# Patient Record
Sex: Female | Born: 1955 | Race: White | Hispanic: No | Marital: Single | Smoking: Former smoker
Health system: Southern US, Community
[De-identification: ages and names within clinical notes are randomized; demographics above are authoritative.]

## PROBLEM LIST (undated history)

## (undated) DIAGNOSIS — J45909 Unspecified asthma, uncomplicated: Secondary | ICD-10-CM

## (undated) DIAGNOSIS — F419 Anxiety disorder, unspecified: Secondary | ICD-10-CM

## (undated) DIAGNOSIS — F209 Schizophrenia, unspecified: Secondary | ICD-10-CM

## (undated) DIAGNOSIS — R569 Unspecified convulsions: Secondary | ICD-10-CM

## (undated) DIAGNOSIS — K589 Irritable bowel syndrome without diarrhea: Secondary | ICD-10-CM

## (undated) DIAGNOSIS — Z789 Other specified health status: Secondary | ICD-10-CM

## (undated) DIAGNOSIS — F32A Depression, unspecified: Secondary | ICD-10-CM

## (undated) DIAGNOSIS — I10 Essential (primary) hypertension: Secondary | ICD-10-CM

## (undated) DIAGNOSIS — M1990 Unspecified osteoarthritis, unspecified site: Secondary | ICD-10-CM

## (undated) DIAGNOSIS — J449 Chronic obstructive pulmonary disease, unspecified: Secondary | ICD-10-CM

## (undated) DIAGNOSIS — K219 Gastro-esophageal reflux disease without esophagitis: Secondary | ICD-10-CM

## (undated) HISTORY — DX: Depression, unspecified: F32.A

## (undated) HISTORY — DX: Chronic obstructive pulmonary disease, unspecified: J44.9

## (undated) HISTORY — DX: Anxiety disorder, unspecified: F41.9

## (undated) HISTORY — PX: COLONOSCOPY: SHX174

## (undated) HISTORY — DX: Gastro-esophageal reflux disease without esophagitis: K21.9

## (undated) HISTORY — DX: Unspecified convulsions: R56.9

## (undated) HISTORY — DX: Essential (primary) hypertension: I10

---

## 1990-09-19 HISTORY — PX: DENTAL SURGERY: SHX609

## 2008-10-31 DIAGNOSIS — F309 Manic episode, unspecified: Secondary | ICD-10-CM | POA: Insufficient documentation

## 2008-10-31 DIAGNOSIS — K589 Irritable bowel syndrome without diarrhea: Secondary | ICD-10-CM | POA: Insufficient documentation

## 2017-11-14 DIAGNOSIS — F209 Schizophrenia, unspecified: Secondary | ICD-10-CM | POA: Insufficient documentation

## 2017-11-14 DIAGNOSIS — J45909 Unspecified asthma, uncomplicated: Secondary | ICD-10-CM | POA: Insufficient documentation

## 2017-11-14 DIAGNOSIS — Z720 Tobacco use: Secondary | ICD-10-CM | POA: Insufficient documentation

## 2017-11-14 DIAGNOSIS — M1990 Unspecified osteoarthritis, unspecified site: Secondary | ICD-10-CM | POA: Insufficient documentation

## 2018-06-19 ENCOUNTER — Other Ambulatory Visit: Payer: Self-pay

## 2018-06-19 ENCOUNTER — Emergency Department (HOSPITAL_COMMUNITY)
Admission: EM | Admit: 2018-06-19 | Discharge: 2018-06-19 | Disposition: A | Discharge status: Home / Self Care | Payer: Medicare Other | Attending: Emergency Medicine | Admitting: Emergency Medicine

## 2018-06-19 ENCOUNTER — Emergency Department (HOSPITAL_COMMUNITY): Payer: Medicare Other

## 2018-06-19 ENCOUNTER — Encounter (HOSPITAL_COMMUNITY): Payer: Self-pay | Admitting: *Deleted

## 2018-06-19 DIAGNOSIS — Z87891 Personal history of nicotine dependence: Secondary | ICD-10-CM | POA: Diagnosis not present

## 2018-06-19 DIAGNOSIS — R51 Headache: Secondary | ICD-10-CM | POA: Insufficient documentation

## 2018-06-19 DIAGNOSIS — J45909 Unspecified asthma, uncomplicated: Secondary | ICD-10-CM | POA: Insufficient documentation

## 2018-06-19 DIAGNOSIS — R519 Headache, unspecified: Secondary | ICD-10-CM

## 2018-06-19 HISTORY — DX: Unspecified osteoarthritis, unspecified site: M19.90

## 2018-06-19 HISTORY — DX: Schizophrenia, unspecified: F20.9

## 2018-06-19 HISTORY — DX: Irritable bowel syndrome, unspecified: K58.9

## 2018-06-19 HISTORY — DX: Unspecified asthma, uncomplicated: J45.909

## 2018-06-19 HISTORY — DX: Other specified health status: Z78.9

## 2018-06-19 LAB — BASIC METABOLIC PANEL
ANION GAP: 14 (ref 5–15)
BUN: 11 mg/dL (ref 8–23)
CALCIUM: 9.8 mg/dL (ref 8.9–10.3)
CO2: 24 mmol/L (ref 22–32)
Chloride: 101 mmol/L (ref 98–111)
Creatinine, Ser: 0.6 mg/dL (ref 0.44–1.00)
Glucose, Bld: 95 mg/dL (ref 70–99)
POTASSIUM: 3.6 mmol/L (ref 3.5–5.1)
SODIUM: 139 mmol/L (ref 135–145)

## 2018-06-19 LAB — CBC WITH DIFFERENTIAL/PLATELET
BASOS ABS: 0 10*3/uL (ref 0.0–0.1)
BASOS PCT: 0 %
EOS ABS: 0 10*3/uL (ref 0.0–0.7)
EOS PCT: 1 %
HCT: 41.3 % (ref 36.0–46.0)
Hemoglobin: 13.8 g/dL (ref 12.0–15.0)
Lymphocytes Relative: 29 %
Lymphs Abs: 2.2 10*3/uL (ref 0.7–4.0)
MCH: 28.9 pg (ref 26.0–34.0)
MCHC: 33.4 g/dL (ref 30.0–36.0)
MCV: 86.4 fL (ref 78.0–100.0)
Monocytes Absolute: 0.4 10*3/uL (ref 0.1–1.0)
Monocytes Relative: 6 %
Neutro Abs: 5 10*3/uL (ref 1.7–7.7)
Neutrophils Relative %: 64 %
Platelets: 283 10*3/uL (ref 150–400)
RBC: 4.78 MIL/uL (ref 3.87–5.11)
RDW: 14.7 % (ref 11.5–15.5)
WBC: 7.7 10*3/uL (ref 4.0–10.5)

## 2018-06-19 MED ORDER — DIPHENHYDRAMINE HCL 50 MG/ML IJ SOLN
25.0000 mg | Freq: Once | INTRAMUSCULAR | Status: AC
  Administered 2018-06-19: 25 mg via INTRAVENOUS
  Filled 2018-06-19: qty 1

## 2018-06-19 MED ORDER — METOCLOPRAMIDE HCL 5 MG/ML IJ SOLN
10.0000 mg | Freq: Once | INTRAMUSCULAR | Status: AC
  Administered 2018-06-19: 10 mg via INTRAVENOUS
  Filled 2018-06-19: qty 2

## 2018-06-19 NOTE — ED Notes (Signed)
Pt d/c upon cab voucher to ALF

## 2018-06-19 NOTE — ED Notes (Addendum)
Pt states that her pressure was high, and that she had had associated hot flashes. Pt also reports having a HA 3/10 ache pain. X 1 week.  Pt states that she does not take blood pressure mediation and has not taken and medication for her HA.

## 2018-06-19 NOTE — ED Notes (Signed)
Joyce Solis contacted to update her on pt discharge status/

## 2018-06-19 NOTE — ED Triage Notes (Signed)
Per EMS- Patient is from a group home. Patient has a history of schizophrenia.  patient c/o headache and hot flashes

## 2018-06-19 NOTE — ED Notes (Signed)
Pt is from [facility]. Manager Heber [place] called to get update on Pt status. Pt will need a cab voucher home per staff, and facility would like to be notified upon pt discharge @ [phone]

## 2018-06-19 NOTE — ED Provider Notes (Signed)
[facility] DEPT Provider Note   CSN: [id number] Arrival date & time: 06/19/18  1544     History   Chief Complaint Chief Complaint  Patient presents with  . Headache  . Hot Flashes    HPI Joyce Solis is a 62 y.o. female.  The history is provided by the patient. No language interpreter was used.  Headache     Joyce Solis is a 62 y.o. female who presents to the Emergency Department complaining of HA. She presents to the emergency department for evaluation of headache that began earlier today. She states that she had a headache that started on the top of her head with associated sweating spells. Headache is throbbing in nature and 7/10 in severity. It is nonradiating. No history of prior similar symptoms. She has not taken any medications for this pain. She denies any vision changes, numbness, weakness, fever, vomiting, chest pain, abdominal pain, dysuria. She has a history of schizophrenia as well as high blood pressure. She does not know what medication she takes at home. She denies any recent medication changes. Past Medical History:  Diagnosis Date  . Arthritis   . Asthma   . IBS (irritable bowel syndrome)   . Medical history non-contributory   . Schizophrenia ([place])     There are no active problems to display for this patient.   History reviewed. No pertinent surgical history.   OB History   None      Home Medications    Prior to Admission medications   Not on File    Family History No family history on file.  Social History Social History   Tobacco Use  . Smoking status: Former Smoker    Types: Cigarettes    Last attempt to quit: 10/20/2017    Years since quitting: 0.6  . Smokeless tobacco: Never Used  Substance Use Topics  . Alcohol use: Never    Frequency: Never  . Drug use: Never     Allergies   Patient has no known allergies.   Review of Systems Review of Systems  Neurological: Positive for headaches.    All other systems reviewed and are negative.    Physical Exam Updated Vital Signs BP (!) 176/89 (BP Location: Left Arm)   Pulse 76   Temp 98.8 F (37.1 C) (Oral)   Resp 20   Ht 5\' 3"  (1.6 m)   Wt 80.7 kg   SpO2 96%   BMI 31.53 kg/m   Physical Exam  Constitutional: She is oriented to person, place, and time. She appears well-developed and well-nourished.  HENT:  Head: Normocephalic and atraumatic.  Eyes: Pupils are equal, round, and reactive to light. EOM are normal.  Neck: Neck supple.  Cardiovascular: Normal rate and regular rhythm.  No murmur heard. Pulmonary/Chest: Effort normal and breath sounds normal. No respiratory distress.  Abdominal: Soft. There is no tenderness. There is no rebound and no guarding.  Musculoskeletal: She exhibits no edema or tenderness.  Neurological: She is alert and oriented to person, place, and time.  No facial asymmetry. Five out of five strength in all four extremities with sensation to light touch intact in all four extremities. No pronator drift. Visual fields are grossly intact.  Skin: Skin is warm and dry.  Psychiatric: She has a normal mood and affect. Her behavior is normal.  Nursing note and vitals reviewed.    ED Treatments / Results  Labs (all labs ordered are listed, but only abnormal results are displayed) Labs  Reviewed  BASIC METABOLIC PANEL  CBC WITH DIFFERENTIAL/PLATELET    EKG None  Radiology Ct Head Wo Contrast  Result Date: 06/19/2018 CLINICAL DATA:  Altered mental status with headache. EXAM: CT HEAD WITHOUT CONTRAST TECHNIQUE: Contiguous axial images were obtained from the base of the skull through the vertex without intravenous contrast. COMPARISON:  None. FINDINGS: Brain: No evidence of acute infarction, hemorrhage, hydrocephalus, extra-axial collection or mass lesion/mass effect. Vascular: No hyperdense vessel or unexpected calcification. Skull: Normal. Negative for fracture or focal lesion. Sinuses/Orbits: No  acute finding. Other: None. IMPRESSION: No acute findings. Electronically Signed   By: Marin Olp M.D.   On: 06/19/2018 18:31    Procedures Procedures (including critical care time)  Medications Ordered in ED Medications  diphenhydrAMINE (BENADRYL) injection 25 mg (25 mg Intravenous Given 06/19/18 1811)  metoCLOPramide (REGLAN) injection 10 mg (10 mg Intravenous Given 06/19/18 1810)     Initial Impression / Assessment and Plan / ED Course  I have reviewed the triage vital signs and the nursing notes.  Pertinent labs & imaging results that were available during my care of the patient were reviewed by me and considered in my medical decision making (see chart for details).     Patient here for evaluation of headache that began earlier today. She is alert and non-toxic appearing on examination with no focal neurologic deficits. Given no history of prior similar headaches, CT scan obtained, which is negative for acute disease process. Presentation is not consistent with subarachnoid hemorrhage, meningitis, dural sinus thrombosis, hypertensive urgency. Following treatment with medications in the department her headache has resolved. Plan to discharge home with outpatient follow-up and return precautions.  Final Clinical Impressions(s) / ED Diagnoses   Final diagnoses:  Bad headache    ED Discharge Orders    None       Quintella Reichert, MD 06/19/18 1956

## 2018-06-19 NOTE — ED Notes (Signed)
Heber [place] manger at ALF,  called from [facility] to get update

## 2018-06-19 NOTE — Progress Notes (Addendum)
Consult request has been received. CSW attempting to follow up at present time.  CSW providing taxi voucher shortly.   9:23 PM   CSW spoke to [place], Freight forwarder at the pt's group home at ph: [phone] and Raquel Sarna states that she is alone at the group home and cannot pick up the pt.  CSW counseled Raquel Sarna in the future that arrangements would have to be made by the group home to transport the pt home.  Raquel Sarna voiced understanding that the group home would have to have someone standing by at pt's D/C should the pt return to the ED in the future.  CSW will continue to follow for D/C needs.  Joyce Solis. Joyce Bilyeu, LCSW, LCAS, CSI Clinical Social Worker Ph: [phone]

## 2019-08-16 ENCOUNTER — Other Ambulatory Visit: Payer: Self-pay

## 2019-08-16 ENCOUNTER — Emergency Department (HOSPITAL_COMMUNITY)
Admission: EM | Admit: 2019-08-16 | Discharge: 2019-08-16 | Disposition: A | Discharge status: Home / Self Care | Payer: Medicare Other | Attending: Emergency Medicine | Admitting: Emergency Medicine

## 2019-08-16 ENCOUNTER — Encounter (HOSPITAL_COMMUNITY): Payer: Self-pay

## 2019-08-16 ENCOUNTER — Emergency Department (HOSPITAL_COMMUNITY): Payer: Medicare Other

## 2019-08-16 DIAGNOSIS — J45909 Unspecified asthma, uncomplicated: Secondary | ICD-10-CM | POA: Diagnosis not present

## 2019-08-16 DIAGNOSIS — F17210 Nicotine dependence, cigarettes, uncomplicated: Secondary | ICD-10-CM | POA: Diagnosis not present

## 2019-08-16 DIAGNOSIS — Z8679 Personal history of other diseases of the circulatory system: Secondary | ICD-10-CM | POA: Insufficient documentation

## 2019-08-16 DIAGNOSIS — R519 Headache, unspecified: Secondary | ICD-10-CM | POA: Diagnosis not present

## 2019-08-16 DIAGNOSIS — K0889 Other specified disorders of teeth and supporting structures: Secondary | ICD-10-CM | POA: Insufficient documentation

## 2019-08-16 LAB — BASIC METABOLIC PANEL
Anion gap: 10 (ref 5–15)
BUN: 12 mg/dL (ref 8–23)
CO2: 26 mmol/L (ref 22–32)
Calcium: 9.4 mg/dL (ref 8.9–10.3)
Chloride: 103 mmol/L (ref 98–111)
Creatinine, Ser: 0.73 mg/dL (ref 0.44–1.00)
GFR calc Af Amer: >60 mL/min (ref >60)
GFR calc non Af Amer: >60 mL/min (ref >60)
Glucose, Bld: 90 mg/dL (ref 70–99)
Potassium: 3.9 mmol/L (ref 3.5–5.1)
Sodium: 139 mmol/L (ref 135–145)

## 2019-08-16 LAB — CBC WITH DIFFERENTIAL/PLATELET
Abs Immature Granulocytes: 0.01 10*3/uL (ref 0.00–0.07)
Basophils Absolute: 0.1 10*3/uL (ref 0.0–0.1)
Basophils Relative: 1 %
Eosinophils Absolute: 0.1 10*3/uL (ref 0.0–0.5)
Eosinophils Relative: 2 %
HCT: 43.5 % (ref 36.0–46.0)
Hemoglobin: 14.4 g/dL (ref 12.0–15.0)
Immature Granulocytes: 0 %
Lymphocytes Relative: 32 %
Lymphs Abs: 2.4 10*3/uL (ref 0.7–4.0)
MCH: 29.6 pg (ref 26.0–34.0)
MCHC: 33.1 g/dL (ref 30.0–36.0)
MCV: 89.3 fL (ref 80.0–100.0)
Monocytes Absolute: 0.7 10*3/uL (ref 0.1–1.0)
Monocytes Relative: 9 %
Neutro Abs: 4.3 10*3/uL (ref 1.7–7.7)
Neutrophils Relative %: 56 %
Platelets: 248 10*3/uL (ref 150–400)
RBC: 4.87 MIL/uL (ref 3.87–5.11)
RDW: 13.6 % (ref 11.5–15.5)
WBC: 7.5 10*3/uL (ref 4.0–10.5)
nRBC: 0 % (ref 0.0–0.2)

## 2019-08-16 MED ORDER — DIPHENHYDRAMINE HCL 50 MG/ML IJ SOLN
25.0000 mg | Freq: Once | INTRAMUSCULAR | Status: AC
  Administered 2019-08-16: 25 mg via INTRAVENOUS
  Filled 2019-08-16: qty 1

## 2019-08-16 MED ORDER — IBUPROFEN 600 MG PO TABS: 600.0000 mg | ORAL_TABLET | Freq: Four times a day (QID) | ORAL | 0 refills | Status: AC | PRN

## 2019-08-16 MED ORDER — METOCLOPRAMIDE HCL 5 MG/ML IJ SOLN
10.0000 mg | Freq: Once | INTRAMUSCULAR | Status: AC
  Administered 2019-08-16: 10 mg via INTRAVENOUS
  Filled 2019-08-16: qty 2

## 2019-08-16 NOTE — ED Notes (Signed)
Dimmed lights, pt states pain in head and eye have subsided.

## 2019-08-16 NOTE — ED Provider Notes (Addendum)
[facility] EMERGENCY DEPARTMENT Provider Note   CSN: [id number] Arrival date & time: 08/16/19  1423     History   Chief Complaint Chief Complaint  Patient presents with  . Headache    HPI Joyce Solis is a 63 y.o. female.  Presents emerged from with chief complaint of headache.  Patient reports over the past couple days she has had intermittent headache, then today headache becoming more severe.  Worse on right side.  No alleviating factors.  Reports headache currently 3 out of 10 in severity.  She has no associated numbness, weakness, vision changes, gait changes.  No chest pain, difficulty breathing, abdominal pain, dizziness or syncope.  Patient denies any head imaging since she was last seen in the ER at [facility].  She remembers someone mentioning the word aneurysm or embolism, but cannot recall any further details.  Chart review patient presented to [facility] ER 2019 for headache, CT head negative, symptoms resolved after Reglan and Benadryl.     HPI  Past Medical History:  Diagnosis Date  . Arthritis   . Asthma   . IBS (irritable bowel syndrome)   . Medical history non-contributory   . Schizophrenia ([place])     There are no active problems to display for this patient.   History reviewed. No pertinent surgical history.   OB History   No obstetric history on file.      Home Medications    Prior to Admission medications   Not on File    Family History No family history on file.  Social History Social History   Tobacco Use  . Smoking status: Current Every Day Smoker    Types: Cigarettes    Last attempt to quit: 10/20/2017    Years since quitting: 1.8  . Smokeless tobacco: Never Used  . Tobacco comment: 8 cigarettes per day  Substance Use Topics  . Alcohol use: Never    Frequency: Never  . Drug use: Never     Allergies   Patient has no known allergies.   Review of Systems Review of Systems  Constitutional: Negative  for chills and fever.  HENT: Negative for ear pain and sore throat.   Eyes: Negative for pain and visual disturbance.  Respiratory: Negative for cough and shortness of breath.   Cardiovascular: Negative for chest pain and palpitations.  Gastrointestinal: Negative for abdominal pain and vomiting.  Genitourinary: Negative for dysuria and hematuria.  Musculoskeletal: Negative for arthralgias and back pain.  Skin: Negative for color change and rash.  Neurological: Positive for headaches. Negative for seizures and syncope.  All other systems reviewed and are negative.    Physical Exam Updated Vital Signs BP (!) 151/73   Pulse 70   Temp 98.3 F (36.8 C) (Oral)   Resp 16   Ht 5\' 3"  (1.6 m)   Wt 77.1 kg   SpO2 96%   BMI 30.11 kg/m   Physical Exam Vitals signs and nursing note reviewed.  Constitutional:      General: She is not in acute distress.    Appearance: She is well-developed.  HENT:     Head: Normocephalic and atraumatic.     Comments: No TTP over temporal regions b/l Eyes:     General: No visual field deficit.    Conjunctiva/sclera: Conjunctivae normal.  Neck:     Musculoskeletal: Neck supple.  Cardiovascular:     Rate and Rhythm: Normal rate and regular rhythm.     Heart sounds: No murmur.  Pulmonary:     Effort: Pulmonary effort is normal. No respiratory distress.     Breath sounds: Normal breath sounds.  Abdominal:     Palpations: Abdomen is soft.     Tenderness: There is no abdominal tenderness.  Skin:    General: Skin is warm and dry.  Neurological:     Mental Status: She is alert and oriented to person, place, and time.     GCS: GCS eye subscore is 4. GCS verbal subscore is 5. GCS motor subscore is 6.     Cranial Nerves: No cranial nerve deficit, dysarthria or facial asymmetry.     Sensory: No sensory deficit.     Motor: No weakness.     Coordination: Coordination normal.     Gait: Gait normal.  Psychiatric:        Mood and Affect: Mood normal.         Behavior: Behavior normal.      ED Treatments / Results  Labs (all labs ordered are listed, but only abnormal results are displayed) Labs Reviewed - No data to display  EKG None  Radiology No results found.  Procedures Procedures (including critical care time)  Medications Ordered in ED Medications - No data to display   Initial Impression / Assessment and Plan / ED Course  I have reviewed the triage vital signs and the nursing notes.  Pertinent labs & imaging results that were available during my care of the patient were reviewed by me and considered in my medical decision making (see chart for details).  Clinical Course as of Aug 15 1532  Fri Aug 16, 2019  1532 Complete initial assessment   [RD]    Clinical Course User Index [RD] Joyce Starch, MD       63 year old lady presented to ER with headache.  On exam well-appearing, normal neuro exam.  CT head negative for acute process.  Symptoms resolved after single headache cocktail.  Labs within normal limits.  Suspect tension type headache.  At this time believe she is appropriate for discharge and outpatient management.  Will discharge home.    After the discussed management above, the patient was determined to be safe for discharge.  The patient was in agreement with this plan and all questions regarding their care were answered.  ED return precautions were discussed and the patient will return to the ED with any significant worsening of condition.    Final Clinical Impressions(s) / ED Diagnoses   Final diagnoses:  Nonintractable headache, unspecified chronicity pattern, unspecified headache type    ED Discharge Orders    None       Joyce Starch, MD 08/16/19 1830    Joyce Starch, MD 08/16/19 [phone]

## 2019-08-16 NOTE — Discharge Instructions (Signed)
Recommend Tylenol, Motrin as needed for pain control.  Please return to ER if you develop fever, worsening headache, vision changes, numbness, weakness or other new concerning symptom.  Otherwise recommend follow-up with your primary doctor next week.

## 2019-08-16 NOTE — ED Triage Notes (Signed)
Pt brought to ED from group home via EMS with c/o right sided headache. Pt reported right sided dental pain and right arm weakness to EMS. 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 Hx of schizophrenia. Pt is currently A&O x 4. Currently denies dental pain, rates headache 3/10. Strong grips bilaterally, pupils equally round and reactive, denies n/v. Hemorrhage noted on right sclera. Pt denies hx of falls.

## 2019-08-16 NOTE — ED Notes (Signed)
Group home called at [phone] notified patient is being discharge and Joyce Solis will send someone for transportation.

## 2020-05-06 ENCOUNTER — Other Ambulatory Visit: Payer: Self-pay | Admitting: Internal Medicine

## 2020-05-06 DIAGNOSIS — Z0000 Encounter for general adult medical examination without abnormal findings: Secondary | ICD-10-CM

## 2020-05-08 ENCOUNTER — Other Ambulatory Visit: Payer: Self-pay | Admitting: Internal Medicine

## 2020-05-08 DIAGNOSIS — E2839 Other primary ovarian failure: Secondary | ICD-10-CM

## 2020-05-21 ENCOUNTER — Ambulatory Visit: Payer: Medicare Other

## 2020-07-03 ENCOUNTER — Other Ambulatory Visit: Payer: Self-pay

## 2020-07-03 ENCOUNTER — Ambulatory Visit
Admission: RE | Admit: 2020-07-03 | Discharge: 2020-07-03 | Disposition: A | Discharge status: Home / Self Care | Payer: Medicare Other | Source: Ambulatory Visit | Attending: Internal Medicine | Admitting: Internal Medicine

## 2020-07-03 DIAGNOSIS — Z0000 Encounter for general adult medical examination without abnormal findings: Secondary | ICD-10-CM

## 2020-09-03 ENCOUNTER — Other Ambulatory Visit: Payer: Medicare Other

## 2021-05-25 ENCOUNTER — Other Ambulatory Visit: Payer: Self-pay | Admitting: Internal Medicine

## 2021-05-26 LAB — CBC
HCT: 39 % (ref 35.0–45.0)
Hemoglobin: 13.5 g/dL (ref 11.7–15.5)
MCH: 29.5 pg (ref 27.0–33.0)
MCHC: 34.6 g/dL (ref 32.0–36.0)
MCV: 85.3 fL (ref 80.0–100.0)
MPV: 9 fL (ref 7.5–12.5)
Platelets: 393 10*3/uL (ref 140–400)
RBC: 4.57 10*6/uL (ref 3.80–5.10)
RDW: 13.2 % (ref 11.0–15.0)
WBC: 10 10*3/uL (ref 3.8–10.8)

## 2021-05-26 LAB — LIPID PANEL
Cholesterol: 181 mg/dL (ref <200)
HDL: 41 mg/dL — ABNORMAL LOW (ref >=50)
LDL Cholesterol (Calc): 113 mg/dL (calc) — ABNORMAL HIGH
Non-HDL Cholesterol (Calc): 140 mg/dL (calc) — ABNORMAL HIGH (ref <130)
Total CHOL/HDL Ratio: 4.4 (calc) (ref <5.0)
Triglycerides: 159 mg/dL — ABNORMAL HIGH (ref <150)

## 2021-05-26 LAB — COMPLETE METABOLIC PANEL WITH GFR
AG Ratio: 1.4 (calc) (ref 1.0–2.5)
ALT: 13 U/L (ref 6–29)
AST: 13 U/L (ref 10–35)
Albumin: 3.9 g/dL (ref 3.6–5.1)
Alkaline phosphatase (APISO): 72 U/L (ref 37–153)
BUN: 10 mg/dL (ref 7–25)
CO2: 24 mmol/L (ref 20–32)
Calcium: 9.4 mg/dL (ref 8.6–10.4)
Chloride: 100 mmol/L (ref 98–110)
Creat: 0.78 mg/dL (ref 0.50–1.05)
Globulin: 2.8 g/dL (calc) (ref 1.9–3.7)
Glucose, Bld: 82 mg/dL (ref 65–99)
Potassium: 4.2 mmol/L (ref 3.5–5.3)
Sodium: 137 mmol/L (ref 135–146)
Total Bilirubin: 0.3 mg/dL (ref 0.2–1.2)
Total Protein: 6.7 g/dL (ref 6.1–8.1)
eGFR: 84 mL/min/{1.73_m2} (ref >=60)

## 2021-05-26 LAB — TSH: TSH: 1.35 mIU/L (ref 0.40–4.50)

## 2021-05-26 LAB — VITAMIN D 25 HYDROXY (VIT D DEFICIENCY, FRACTURES): Vit D, 25-Hydroxy: 42 ng/mL (ref 30–100)

## 2021-08-31 ENCOUNTER — Other Ambulatory Visit: Payer: Self-pay | Admitting: Internal Medicine

## 2021-08-31 DIAGNOSIS — Z1231 Encounter for screening mammogram for malignant neoplasm of breast: Secondary | ICD-10-CM

## 2021-09-01 ENCOUNTER — Other Ambulatory Visit: Payer: Self-pay

## 2021-09-01 ENCOUNTER — Ambulatory Visit
Admission: RE | Admit: 2021-09-01 | Discharge: 2021-09-01 | Disposition: A | Discharge status: Home / Self Care | Payer: Medicare Other | Source: Ambulatory Visit | Attending: Internal Medicine | Admitting: Internal Medicine

## 2021-09-01 DIAGNOSIS — Z1231 Encounter for screening mammogram for malignant neoplasm of breast: Secondary | ICD-10-CM

## 2022-05-03 ENCOUNTER — Other Ambulatory Visit: Payer: Self-pay | Admitting: Internal Medicine

## 2022-05-03 DIAGNOSIS — Z1231 Encounter for screening mammogram for malignant neoplasm of breast: Secondary | ICD-10-CM

## 2022-07-01 ENCOUNTER — Encounter: Payer: Self-pay | Admitting: Gastroenterology

## 2022-07-06 ENCOUNTER — Ambulatory Visit (AMBULATORY_SURGERY_CENTER): Payer: Medicare Other

## 2022-07-06 VITALS — Ht 62.0 in | Wt 180.0 lb

## 2022-07-06 DIAGNOSIS — Z1211 Encounter for screening for malignant neoplasm of colon: Secondary | ICD-10-CM

## 2022-07-06 MED ORDER — PLENVU 140 G PO SOLR: 1.0000 | ORAL | 0 refills | Status: DC

## 2022-07-06 NOTE — Progress Notes (Signed)
No egg or soy allergy known to patient  No issues known to pt with past sedation with any surgeries or procedures Patient denies ever being told they had issues or difficulty with intubation  No FH of Malignant Hyperthermia Pt is not on diet pills Pt is not on home 02  Pt is not on blood thinners  Pt denies issues with constipation  No A fib or A flutter Have any cardiac testing pending--NO Pt instructed to use [employer] or GoodRx for a price reduction on prep  Pre Visit conducted with patient and med tech supervisor Nevada Crane for assistance when patient unaware of answer to questions  Instructions faxed to [phone] per Nevada Crane request; confirmation received that fax went through to [facility];

## 2022-07-29 ENCOUNTER — Telehealth: Payer: Self-pay | Admitting: Gastroenterology

## 2022-07-29 MED ORDER — PLENVU 140 G PO SOLR: 1.0000 | ORAL | 0 refills | Status: DC

## 2022-07-29 NOTE — Telephone Encounter (Signed)
Received call regarding prep medication please send to [facility]

## 2022-07-29 NOTE — Telephone Encounter (Signed)
Resent rx to [facility] for plenvu

## 2022-08-01 NOTE — Telephone Encounter (Addendum)
Received called regarding prep medication, states the insurance does not cover it and is requesting a generic. Please advise

## 2022-08-02 ENCOUNTER — Ambulatory Visit (INDEPENDENT_AMBULATORY_CARE_PROVIDER_SITE_OTHER): Payer: Medicare Other | Admitting: Podiatry

## 2022-08-02 ENCOUNTER — Telehealth: Payer: Self-pay

## 2022-08-02 ENCOUNTER — Encounter: Payer: Self-pay | Admitting: Podiatry

## 2022-08-02 DIAGNOSIS — M79676 Pain in unspecified toe(s): Secondary | ICD-10-CM | POA: Diagnosis not present

## 2022-08-02 DIAGNOSIS — B351 Tinea unguium: Secondary | ICD-10-CM

## 2022-08-02 NOTE — Telephone Encounter (Signed)
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  Joyce Solis to call pharmacy and make sure they are billing medicaid and not medicare so the prep will be $4.

## 2022-08-02 NOTE — Telephone Encounter (Signed)
Call back to [facility] re: pt prep to pick up with [facility] payor

## 2022-08-02 NOTE — Telephone Encounter (Addendum)
Hassan Rowan patient's caregiver follow up on the medication. Prefers generic form so insurance can pay for the medication. Prep starts tomorrow. Please call to advise. Than you

## 2022-08-02 NOTE — Telephone Encounter (Signed)
Inbound call from [facility] stating the patient needs a generic brand sent into the pharmacy so that medicaid will pay for prescription. Please give a call to further advise.  Thank you

## 2022-08-02 NOTE — Progress Notes (Signed)
Subjective:  Patient ID: Joyce Solis, female    DOB: [birth year],  MRN: [id number] HPI Chief Complaint  Patient presents with   Toe Pain    Hallux right - medial border, tender for about 4 days, noticed it was hurting when she was walking, bumped it on the bedpost and thinks it helped it cause it hadn't been too bad after that   New Patient (Initial Visit)    66 y.o. female presents with the above complaint.   ROS: Denies fever chills nausea vomiting muscle aches pains calf pain back pain chest pain shortness of breath.  Past Medical History:  Diagnosis Date   Anxiety    on meds   Arthritis    Asthma    uses inhaler   COPD (chronic obstructive pulmonary disease) ([place])    uses inhaler   Depression    on meds   GERD (gastroesophageal reflux disease)    Hypertension    on meds   IBS (irritable bowel syndrome)    Medical history non-contributory    Schizophrenia ([place])    Seizures ([place])    2022 due to high blood pressure and medications   Past Surgical History:  Procedure Laterality Date   DENTAL SURGERY  1992    Current Outpatient Medications:    benztropine (COGENTIN) 1 MG tablet, Take 1 mg by mouth daily., Disp: , Rfl:    cholecalciferol (VITAMIN D3) 25 MCG (1000 UNIT) tablet, Take 2 tablets by mouth daily at 6 (six) AM., Disp: , Rfl:    clotrimazole-betamethasone (LOTRISONE) cream, Apply 1 Application topically daily as needed., Disp: , Rfl:    cyanocobalamin (VITAMIN B12) 500 MCG tablet, Take 1 tablet by mouth daily at 6 (six) AM., Disp: , Rfl:    docusate sodium (COLACE) 100 MG capsule, Take 200 mg by mouth as needed for mild constipation or moderate constipation., Disp: , Rfl:    hydrOXYzine (ATARAX) 25 MG tablet, Take 25 mg by mouth 2 (two) times daily as needed., Disp: , Rfl:    ibuprofen (ADVIL) 600 MG tablet, Take 1 tablet (600 mg total) by mouth every 6 (six) hours as needed for headache., Disp: 30 tablet, Rfl: 0   ibuprofen (ADVIL) 800 MG tablet, Take 800 mg  by mouth every 6 (six) hours as needed., Disp: , Rfl:    PEG-KCl-NaCl-NaSulf-Na Asc-C (PLENVU) 140 g SOLR, Take 1 kit by mouth as directed. Patient has Medicaid- this should be covered under her insurance and be $4.00, Disp: 1 each, Rfl: 0   PROMETHAZINE-DM PO, Take 10 mLs by mouth 2 (two) times daily as needed (cough and congestion)., Disp: , Rfl:    risperiDONE (RISPERDAL) 2 MG tablet, Take 1 tablet by mouth at bedtime. Mood stabilization, Disp: , Rfl:    risperiDONE (RISPERDAL) 2 MG tablet, Take 0.5 tablets by mouth 2 (two) times daily., Disp: , Rfl:    sertraline (ZOLOFT) 50 MG tablet, Take 1.5 tablets by mouth daily at 6 (six) AM., Disp: , Rfl:    SYMBICORT 160-4.5 MCG/ACT inhaler, Inhale 2 puffs into the lungs in the morning and at bedtime., Disp: , Rfl:    triamcinolone cream (KENALOG) 0.5 %, Apply 1 Application topically in the morning and at bedtime., Disp: , Rfl:    VENTOLIN HFA 108 (90 Base) MCG/ACT inhaler, Inhale 2 puffs into the lungs 4 (four) times daily as needed., Disp: , Rfl:   Allergies  Allergen Reactions   Piroxicam Shortness Of Breath   Review of Systems Objective:  There were no vitals filed for this visit.  General: Well developed, nourished, in no acute distress, alert and oriented x3   Dermatological: Skin is warm, dry and supple bilateral. Nails x 10 are thick yellow dystrophic clinically mycotic no signs of infection or paronychia's.  Remaining integument appears unremarkable at this time. There are no open sores, no preulcerative lesions, no rash or signs of infection present.  Vascular: Dorsalis Pedis artery and Posterior Tibial artery pedal pulses are 2/4 bilateral with immedate capillary fill time. Pedal hair growth present. No varicosities and no lower extremity edema present bilateral.   Neruologic: Grossly intact via light touch bilateral. Vibratory intact via tuning fork bilateral. Protective threshold with Semmes Wienstein monofilament intact to all pedal  sites bilateral. Patellar and Achilles deep tendon reflexes 2+ bilateral. No Babinski or clonus noted bilateral.   Musculoskeletal: No gross boney pedal deformities bilateral. No pain, crepitus, or limitation noted with foot and ankle range of motion bilateral. Muscular strength 5/5 in all groups tested bilateral.  Gait: Unassisted, Nonantalgic.    Radiographs:  None taken  Assessment & Plan:   Assessment: Pain in limb secondary to onychomycosis.  Plan: Debridement of toenails 1 through 5 bilateral.     Reign Dziuba T. [place], [place]

## 2022-08-03 NOTE — Telephone Encounter (Signed)
Spoke with employee group home.  Supervisor, Helene Kelp, will pick up sample of Plenvu today for pt.  Sample left at front desk to pick up

## 2022-08-03 NOTE — Telephone Encounter (Signed)
Inbound call from [facility] at the group home stating patient has to take prep today and patient still has not received prep medication. Please give a call to further advise at [phone]. States patient needs generic brand sent into pharmacy so that insurance will pay for the medication.

## 2022-08-04 ENCOUNTER — Ambulatory Visit (AMBULATORY_SURGERY_CENTER): Payer: Medicare Other | Admitting: Gastroenterology

## 2022-08-04 ENCOUNTER — Encounter: Payer: Self-pay | Admitting: Gastroenterology

## 2022-08-04 VITALS — BP 126/60 | HR 71 | Temp 96.6°F | Resp 14 | Ht 62.0 in | Wt 180.0 lb

## 2022-08-04 DIAGNOSIS — D125 Benign neoplasm of sigmoid colon: Secondary | ICD-10-CM

## 2022-08-04 DIAGNOSIS — Z1211 Encounter for screening for malignant neoplasm of colon: Secondary | ICD-10-CM

## 2022-08-04 DIAGNOSIS — K5730 Diverticulosis of large intestine without perforation or abscess without bleeding: Secondary | ICD-10-CM

## 2022-08-04 DIAGNOSIS — D120 Benign neoplasm of cecum: Secondary | ICD-10-CM

## 2022-08-04 DIAGNOSIS — Z800 Family history of malignant neoplasm of digestive organs: Secondary | ICD-10-CM

## 2022-08-04 DIAGNOSIS — D123 Benign neoplasm of transverse colon: Secondary | ICD-10-CM | POA: Diagnosis not present

## 2022-08-04 DIAGNOSIS — D122 Benign neoplasm of ascending colon: Secondary | ICD-10-CM

## 2022-08-04 DIAGNOSIS — D124 Benign neoplasm of descending colon: Secondary | ICD-10-CM

## 2022-08-04 DIAGNOSIS — K640 First degree hemorrhoids: Secondary | ICD-10-CM

## 2022-08-04 MED ORDER — SODIUM CHLORIDE 0.9 % IV SOLN: 500.0000 mL | Freq: Once | INTRAVENOUS | Status: DC

## 2022-08-04 NOTE — Progress Notes (Signed)
Called to room to assist during endoscopic procedure.  Patient ID and intended procedure confirmed with present staff. Received instructions for my participation in the procedure from the performing physician.  

## 2022-08-04 NOTE — Op Note (Signed)
[name] Procedure Date: 08/04/2022 10:02 AM MRN: [id number] Endoscopist: Gerrit Heck , MD, [id number] Age: 66 Referring MD:  Date of Birth: [birth year] Gender: Female Account #: [id number] Procedure:                Colonoscopy Indications:              Screening for colorectal malignant neoplasm                           Last colonoscopy was ~1989. Otherwise, no recent GI                            symptoms. FHx notable for grandparent with colon                            cancer, age >46. Medicines:                Monitored Anesthesia Care Procedure:                Pre-Anesthesia Assessment:                           - Prior to the procedure, a History and Physical                            was performed, and patient medications and                            allergies were reviewed. The patient's tolerance of                            previous anesthesia was also reviewed. The risks                            and benefits of the procedure and the sedation                            options and risks were discussed with the patient.                            All questions were answered, and informed consent                            was obtained. Prior Anticoagulants: The patient has                            taken no anticoagulant or antiplatelet agents. ASA                            Grade Assessment: III - A patient with severe                            systemic disease. After reviewing the risks and  benefits, the patient was deemed in satisfactory                            condition to undergo the procedure.                           After obtaining informed consent, the colonoscope                            was passed under direct vision. Throughout the                            procedure, the patient's blood pressure, pulse, and                            oxygen saturations were monitored  continuously. The                            CF [id number] #[id number] was introduced through the anus                            and advanced to the the cecum, identified by                            appendiceal orifice and ileocecal valve. The                            colonoscopy was performed without difficulty. The                            patient tolerated the procedure well. The quality                            of the bowel preparation was good. The ileocecal                            valve, appendiceal orifice, and rectum were                            photographed. Scope In: 10:17:58 AM Scope Out: 10:50:03 AM Scope Withdrawal Time: 0 hours 29 minutes 13 seconds  Total Procedure Duration: 0 hours 32 minutes 5 seconds  Findings:                 The perianal and digital rectal examinations were                            normal.                           Eight sessile polyps were found in the transverse                            colon (4), ascending colon (3), and cecum (1). The  polyps were 4 to 8 mm in size. These polyps were                            removed with a cold snare. Resection and retrieval                            were complete. Estimated blood loss was minimal.                           Five sessile polyps were found in the sigmoid colon                            (2) and descending colon (3). The polyps were 3 to                            8 mm in size. These polyps were removed with a cold                            snare. Resection and retrieval were complete.                            Estimated blood loss was minimal.                           Multiple large-mouthed and small-mouthed                            diverticula were found in the sigmoid colon.                           Non-bleeding internal hemorrhoids were found during                            retroflexion. The hemorrhoids were small. Complications:            No  immediate complications. Estimated Blood Loss:     Estimated blood loss was minimal. Impression:               - Eight 4 to 8 mm polyps in the transverse colon,                            in the ascending colon and in the cecum, removed                            with a cold snare. Resected and retrieved.                           - Five 3 to 8 mm polyps in the sigmoid colon and in                            the descending colon, removed with a cold snare.  Resected and retrieved.                           - Diverticulosis in the sigmoid colon.                           - Non-bleeding internal hemorrhoids. Recommendation:           - Patient has a contact number available for                            emergencies. The signs and symptoms of potential                            delayed complications were discussed with the                            patient. Return to normal activities tomorrow.                            Written discharge instructions were provided to the                            patient.                           - Resume previous diet.                           - Continue present medications.                           - Await pathology results.                           - Repeat colonoscopy for surveillance based on                            pathology results.                           - Return to GI clinic PRN. Gerrit Heck, MD 08/04/2022 10:59:13 AM

## 2022-08-04 NOTE — Progress Notes (Signed)
To pacu, VSS. Report to [email or web address].tb 

## 2022-08-04 NOTE — Progress Notes (Signed)
GASTROENTEROLOGY PROCEDURE H&P NOTE   Primary Care Physician: Nolene Ebbs, MD    Reason for Procedure:  Colon Cancer screening  Plan:    Colonoscopy  Patient is appropriate for endoscopic procedure(s) in the ambulatory ([facility]) setting.  The nature of the procedure, as well as the risks, benefits, and alternatives were carefully and thoroughly reviewed with the patient. Ample time for discussion and questions allowed. The patient understood, was satisfied, and agreed to proceed.     HPI: Joyce Solis is a 66 y.o. female who presents for colonoscopy for routine Colon Cancer screening.  No active GI symptoms.  No known family history of colon cancer or related malignancy.  Patient is otherwise without complaints or active issues today.  Past Medical History:  Diagnosis Date   Anxiety    on meds   Arthritis    Asthma    uses inhaler   COPD (chronic obstructive pulmonary disease) ([place])    uses inhaler   Depression    on meds   GERD (gastroesophageal reflux disease)    Hypertension    on meds   IBS (irritable bowel syndrome)    Medical history non-contributory    Schizophrenia ([place])    Seizures ([place])    2022 due to high blood pressure and medications    Past Surgical History:  Procedure Laterality Date   COLONOSCOPY     DENTAL SURGERY  1992    Prior to Admission medications   Medication Sig Start Date End Date Taking? Authorizing Provider  benztropine (COGENTIN) 1 MG tablet Take 1 mg by mouth daily. 06/27/22  Yes [provider]  cholecalciferol (VITAMIN D3) 25 MCG (1000 UNIT) tablet Take 2 tablets by mouth daily at 6 (six) AM. 12/13/17  Yes [provider]  cyanocobalamin (VITAMIN B12) 500 MCG tablet Take 1 tablet by mouth daily at 6 (six) AM. 12/13/17  Yes [provider]  docusate sodium (COLACE) 100 MG capsule Take 200 mg by mouth as needed for mild constipation or moderate constipation.   Yes [provider]  risperiDONE  (RISPERDAL) 2 MG tablet Take 0.5 tablets by mouth 2 (two) times daily. 06/27/22  Yes [provider]  sertraline (ZOLOFT) 50 MG tablet Take 1.5 tablets by mouth daily at 6 (six) AM. 06/27/22  Yes [provider]  SYMBICORT 160-4.5 MCG/ACT inhaler Inhale 2 puffs into the lungs in the morning and at bedtime. 06/27/22  Yes [provider]  VENTOLIN HFA 108 (90 Base) MCG/ACT inhaler Inhale 2 puffs into the lungs 4 (four) times daily as needed. 01/10/22  Yes [provider]  clotrimazole-betamethasone (LOTRISONE) cream Apply 1 Application topically daily as needed. 06/27/22   [provider]  hydrOXYzine (ATARAX) 25 MG tablet Take 25 mg by mouth 2 (two) times daily as needed. 06/27/22   [provider]  ibuprofen (ADVIL) 600 MG tablet Take 1 tablet (600 mg total) by mouth every 6 (six) hours as needed for headache. 08/16/19   [name], MD  PROMETHAZINE-DM PO Take 10 mLs by mouth 2 (two) times daily as needed (cough and congestion).    [provider]  triamcinolone cream (KENALOG) 0.5 % Apply 1 Application topically in the morning and at bedtime. 06/27/22   [provider]    Current Outpatient Medications  Medication Sig Dispense Refill   benztropine (COGENTIN) 1 MG tablet Take 1 mg by mouth daily.     cholecalciferol (VITAMIN D3) 25 MCG (1000 UNIT) tablet Take 2 tablets  by mouth daily at 6 (six) AM.     cyanocobalamin (VITAMIN B12) 500 MCG tablet Take 1 tablet by mouth daily at 6 (six) AM.     docusate sodium (COLACE) 100 MG capsule Take 200 mg by mouth as needed for mild constipation or moderate constipation.     risperiDONE (RISPERDAL) 2 MG tablet Take 0.5 tablets by mouth 2 (two) times daily.     sertraline (ZOLOFT) 50 MG tablet Take 1.5 tablets by mouth daily at 6 (six) AM.     SYMBICORT 160-4.5 MCG/ACT inhaler Inhale 2 puffs into the lungs in the morning and at bedtime.     VENTOLIN HFA 108 (90 Base) MCG/ACT inhaler  Inhale 2 puffs into the lungs 4 (four) times daily as needed.     clotrimazole-betamethasone (LOTRISONE) cream Apply 1 Application topically daily as needed.     hydrOXYzine (ATARAX) 25 MG tablet Take 25 mg by mouth 2 (two) times daily as needed.     ibuprofen (ADVIL) 600 MG tablet Take 1 tablet (600 mg total) by mouth every 6 (six) hours as needed for headache. 30 tablet 0   PROMETHAZINE-DM PO Take 10 mLs by mouth 2 (two) times daily as needed (cough and congestion).     triamcinolone cream (KENALOG) 0.5 % Apply 1 Application topically in the morning and at bedtime.     Current Facility-Administered Medications  Medication Dose Route Frequency Provider Last Rate Last Admin   0.9 %  sodium chloride infusion  500 mL Intravenous Once Adonai Helzer V, DO        Allergies as of 08/04/2022 - Review Complete 08/04/2022  Allergen Reaction Noted   Piroxicam Shortness Of Breath 05/14/2013    Family History  Problem Relation Age of Onset   Colon polyps Neg Hx    Colon cancer Neg Hx    Esophageal cancer Neg Hx    Rectal cancer Neg Hx    Stomach cancer Neg Hx     Social History   Socioeconomic History   Marital status: Single    Spouse name: Not on file   Number of children: Not on file   Years of education: Not on file   Highest education level: Not on file  Occupational History   Not on file  Tobacco Use   Smoking status: Every Day    Types: Cigarettes, Cigars   Smokeless tobacco: Never   Tobacco comments:    10 cigar per day  Vaping Use   Vaping Use: Never used  Substance and Sexual Activity   Alcohol use: Never   Drug use: Never   Sexual activity: Not on file  Other Topics Concern   Not on file  Social History Narrative   Not on file   Social Determinants of Health   Financial Resource Strain: Not on file  Food Insecurity: Not on file  Transportation Needs: Not on file  Physical Activity: Not on file  Stress: Not on file  Social Connections: Not on file   Intimate Partner Violence: Not on file    Physical Exam: Vital signs in last 24 hours: '@BP'$  139/71   Pulse 84   Temp (!) 96.6 F (35.9 C) (Temporal)   Ht '5\' 2"'$  (1.575 m)   Wt 180 lb (81.6 kg)   SpO2 94%   BMI 32.92 kg/m  GEN: NAD EYE: Sclerae anicteric ENT: MMM CV: Non-tachycardic Pulm: CTA b/l GI: Soft, NT/ND NEURO:  Alert & Oriented x [place], DO [facility]  08/04/2022 10:11 AM

## 2022-08-04 NOTE — Progress Notes (Signed)
Pt's states no medical or surgical changes since previsit or office visit. 

## 2022-08-04 NOTE — Patient Instructions (Addendum)
-   Resume previous diet.  - Continue present medications.  - Repeat colonoscopy for surveillance based on pathology results.  - Return to GI clinic PRN.  HANDOUTS given for polyps and diverticulosis.  YOU HAD AN ENDOSCOPIC PROCEDURE TODAY AT [facility]:   Refer to the procedure report that was given to you for any specific questions about what was found during the examination.  If the procedure report does not answer your questions, please call your gastroenterologist to clarify.  If you requested that your care partner not be given the details of your procedure findings, then the procedure report has been included in a sealed envelope for you to review at your convenience later.  YOU SHOULD EXPECT: Some feelings of bloating in the abdomen. Passage of more gas than usual.  Walking can help get rid of the air that was put into your GI tract during the procedure and reduce the bloating. If you had a lower endoscopy (such as a colonoscopy or flexible sigmoidoscopy) you may notice spotting of blood in your stool or on the toilet paper. If you underwent a bowel prep for your procedure, you may not have a normal bowel movement for a few days.  Please Note:  You might notice some irritation and congestion in your nose or some drainage.  This is from the oxygen used during your procedure.  There is no need for concern and it should clear up in a day or so.  SYMPTOMS TO REPORT IMMEDIATELY:  Following lower endoscopy (colonoscopy):  Excessive amounts of blood in the stool  Significant tenderness or worsening of abdominal pains  Swelling of the abdomen that is new, acute  Fever of 100F or higher  For urgent or emergent issues, a gastroenterologist can be reached at any hour by calling [phone]. Do not use MyChart messaging for urgent concerns.    DIET:  We do recommend a small meal at first, but then you may proceed to your regular diet.  Drink plenty of fluids but you should  avoid alcoholic beverages for 24 hours.  ACTIVITY:  You should plan to take it easy for the rest of today and you should NOT DRIVE or use heavy machinery until tomorrow (because of the sedation medicines used during the test).    FOLLOW UP: Our staff will call the number listed on your records the next business day following your procedure.  We will call around 7:15- 8:00 am to check on you and address any questions or concerns that you may have regarding the information given to you following your procedure. If we do not reach you, we will leave a message.     If any biopsies were taken you will be contacted by phone or by letter within the next 1-3 weeks.  Please call us at [phone] if you have not heard about the biopsies in 3 weeks.    SIGNATURES/CONFIDENTIALITY: You and/or your care partner have signed paperwork which will be entered into your electronic medical record.  These signatures attest to the fact that that the information above on your After Visit Summary has been reviewed and is understood.  Full responsibility of the confidentiality of this discharge information lies with you and/or your care-partner.

## 2022-08-05 ENCOUNTER — Telehealth: Payer: Self-pay | Admitting: *Deleted

## 2022-08-05 NOTE — Telephone Encounter (Signed)
  Follow up Call-     08/04/2022    9:53 AM  Call back number  Post procedure Call Back phone  # [place] home where pt lives  Permission to leave phone message Yes     Patient questions:  Do you have a fever, pain , or abdominal swelling? No. Pain Score  0 *  Have you tolerated food without any problems? Yes.    Have you been able to return to your normal activities? Yes.    Do you have any questions about your discharge instructions: Diet   No. Medications  No. Follow up visit  No.  Do you have questions or concerns about your Care? No.  Actions: * If pain score is 4 or above: No action needed, pain <4.

## 2022-08-08 ENCOUNTER — Encounter: Payer: Self-pay | Admitting: Gastroenterology

## 2022-08-09 ENCOUNTER — Other Ambulatory Visit: Payer: Self-pay

## 2022-08-09 DIAGNOSIS — D369 Benign neoplasm, unspecified site: Secondary | ICD-10-CM

## 2022-09-02 ENCOUNTER — Ambulatory Visit
Admission: RE | Admit: 2022-09-02 | Discharge: 2022-09-02 | Disposition: A | Discharge status: Home / Self Care | Payer: Medicare Other | Source: Ambulatory Visit | Attending: Internal Medicine | Admitting: Internal Medicine

## 2022-09-02 DIAGNOSIS — Z1231 Encounter for screening mammogram for malignant neoplasm of breast: Secondary | ICD-10-CM

## 2022-10-24 ENCOUNTER — Other Ambulatory Visit: Payer: Self-pay

## 2022-10-24 ENCOUNTER — Encounter: Payer: Self-pay | Admitting: Licensed Clinical Social Worker

## 2022-10-24 ENCOUNTER — Inpatient Hospital Stay: Payer: Medicare Other | Attending: Nurse Practitioner | Admitting: Licensed Clinical Social Worker

## 2022-10-24 ENCOUNTER — Other Ambulatory Visit: Payer: Self-pay | Admitting: Licensed Clinical Social Worker

## 2022-10-24 ENCOUNTER — Inpatient Hospital Stay: Payer: Medicare Other

## 2022-10-24 DIAGNOSIS — Z800 Family history of malignant neoplasm of digestive organs: Secondary | ICD-10-CM

## 2022-10-24 DIAGNOSIS — Z803 Family history of malignant neoplasm of breast: Secondary | ICD-10-CM

## 2022-10-24 DIAGNOSIS — Z86010 Personal history of colonic polyps: Secondary | ICD-10-CM

## 2022-10-24 LAB — GENETIC SCREENING ORDER

## 2022-10-24 NOTE — Progress Notes (Signed)
REFERRING PROVIDER: Lavena Bullion, DO [place],  [place] [address]  PRIMARY PROVIDER:  Nolene Ebbs, MD  PRIMARY REASON FOR VISIT:  1. Personal history of colonic polyps   2. Family history of breast cancer   3. Family history of colon cancer    HISTORY OF PRESENT ILLNESS:   Joyce Solis, a 67 y.o. female, was seen for a [place] cancer genetics consultation at the request of Dr. Bryan Lemma due to a personal history of colon polyps.  Joyce Solis presents to clinic today to discuss the possibility of a hereditary predisposition to cancer, genetic testing, and to further clarify her future cancer risks, as well as potential cancer risks for family members.   CANCER HISTORY:  Joyce Solis is a 67 y.o. female with no personal history of cancer.    RISK FACTORS:  Menarche was at age 65.  Ovaries intact: yes.  Hysterectomy: no.  Menopausal status: postmenopausal.  HRT use: 0 years. Colonoscopy: yes;  cscope 07/2022 - 13 polyps, tubular adenomas . Reports one other cscope in 1992 that showed 1-2 polyps. Mammogram within the last year: yes. Number of breast biopsies: 0.  Past Medical History:  Diagnosis Date   Anxiety    on meds   Arthritis    Asthma    uses inhaler   COPD (chronic obstructive pulmonary disease) ([place])    uses inhaler   Depression    on meds   GERD (gastroesophageal reflux disease)    Hypertension    on meds   IBS (irritable bowel syndrome)    Medical history non-contributory    Schizophrenia ([place])    Seizures ([place])    2022 due to high blood pressure and medications    Past Surgical History:  Procedure Laterality Date   COLONOSCOPY     DENTAL SURGERY  1992    FAMILY HISTORY:  We obtained a detailed, 4-generation family history.  Significant diagnoses are listed below: Family History  Problem Relation Age of Onset   Lung cancer Father    Breast cancer Sister        dx late 60s   Colon cancer Paternal Grandmother        dx >50    Colon polyps Neg Hx    Esophageal cancer Neg Hx    Rectal cancer Neg Hx    Stomach cancer Neg Hx     Joyce Solis has 4 sisters. One sister had breast cancer in her late 75s and is living at 24.   Joyce Solis mother passed of COPD at 55. No known cancers on this side of the family.  Joyce Solis father passed of lung cancer and had history of smoking. Paternal grandmother had colon cancer over age 14. Paternal grandfather died young, unknown cause.  Joyce Solis is unaware of previous family history of genetic testing for hereditary cancer risks. There is no reported Ashkenazi Jewish ancestry. There is no known consanguinity.  GENETIC COUNSELING ASSESSMENT: Joyce Solis is a 66 y.o. female with a personal history of >10 colon polyps which is somewhat suggestive of a hereditary cancer syndrome and predisposition to cancer. We, therefore, discussed and recommended the following at today's visit.   DISCUSSION:  We discussed that polyps in general are common, however, most people have fewer than 5 lifetime polyps.  When an individual has 10 or more polyps we become concerned about an underlying polyposis syndrome.  The most common hereditary polyposis syndromes are caused by problems in the [facility] and  MUTYH genes. We discussed that testing is beneficial for several reasons including knowing how to follow individuals for cancer screenings, and understand if other family members could be at risk for cancer and allow them to undergo genetic testing.   We reviewed the characteristics, features and inheritance patterns of hereditary cancer syndromes. We also discussed genetic testing, including the appropriate family members to test, the process of testing, insurance coverage and turn-around-time for results. We discussed the implications of a negative, positive and/or variant of uncertain significant result. We recommended Joyce Solis pursue genetic testing for the [facility] Multi-Cancer+RNA gene panel.   Based on  Joyce Solis personal history of colon polyps, she meets medical criteria for genetic testing. Despite that she meets criteria, she may still have an out of pocket cost. We discussed that if her out of pocket cost for testing is over $100, the laboratory will call and confirm whether she wants to proceed with testing.  If the out of pocket cost of testing is less than $100 she will be billed by the genetic testing laboratory.   PLAN: After considering the risks, benefits, and limitations, Joyce Solis provided informed consent to pursue genetic testing and the blood sample was sent to [facility] for analysis of the Multi-Cancer+RNA panel. Results should be available within approximately 2-3 weeks' time, at which point they will be disclosed by telephone to Joyce Solis, as will any additional recommendations warranted by these results. Joyce Solis will receive a summary of her genetic counseling visit and a copy of her results once available. This information will also be available in Epic.   Joyce Solis questions were answered to her satisfaction today. Our contact information was provided should additional questions or concerns arise. Thank you for the referral and allowing [place] to share in the care of your patient.   Joyce Rogue, MS, [facility] Genetic Counselor [email or web address] Phone: [phone]  The patient was seen for a total of 25 minutes in face-to-face genetic counseling.  Patient's caregiver Joyce Solis was also present. Dr. Grayland Ormond was available for discussion regarding this case.   _______________________________________________________________________ For Office Staff:  Number of people involved in session: 2 Was an Intern/ student involved with case: no

## 2022-11-04 ENCOUNTER — Ambulatory Visit: Payer: Self-pay | Admitting: Licensed Clinical Social Worker

## 2022-11-04 ENCOUNTER — Encounter: Payer: Self-pay | Admitting: Licensed Clinical Social Worker

## 2022-11-04 ENCOUNTER — Telehealth: Payer: Self-pay | Admitting: Licensed Clinical Social Worker

## 2022-11-04 DIAGNOSIS — Z1379 Encounter for other screening for genetic and chromosomal anomalies: Secondary | ICD-10-CM

## 2022-11-04 NOTE — Telephone Encounter (Signed)
I contacted Ms. Mcpartland to discuss her genetic testing results. 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 Detailed clinic note to follow.   The test report has been scanned into EPIC and is located under the Molecular Pathology section of the Results Review tab.  A portion of the result report is included below for reference.      Faith Rogue, MS, [facility] Genetic Counselor [email or web address] Phone: [phone]

## 2022-11-04 NOTE — Progress Notes (Signed)
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 Please refer to our prior cancer genetics clinic note for more information regarding our discussion, assessment and recommendations, at the time. Joyce Solis recent genetic test results were disclosed to her, as were recommendations warranted by these results. These results and recommendations are discussed in more detail below.  CANCER HISTORY:  Oncology History   No history exists.    FAMILY HISTORY:  We obtained a detailed, 4-generation family history.  Significant diagnoses are listed below: Family History  Problem Relation Age of Onset   Lung cancer Father    Breast cancer Sister        dx late 40s   Colon cancer Paternal Grandmother        dx >50   Colon polyps Neg Hx    Esophageal cancer Neg Hx    Rectal cancer Neg Hx    Stomach cancer Neg Hx    Joyce Solis has 4 sisters. One sister had breast cancer in her late 104s and is living at 70.    Joyce Solis mother passed of COPD at 29. No known cancers on this side of the family.   Joyce Solis father passed of lung cancer and had history of smoking. Paternal grandmother had colon cancer over age 35. Paternal grandfather died young, unknown cause.   Joyce Solis is unaware of previous family history of genetic testing for hereditary cancer risks. There is no reported Ashkenazi Jewish ancestry. There is no known consanguinity.   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 Of note, this result DOES NOT indicate a diagnosis of familial adenomatous polyposis syndrome (FAP).The remainder of testing was negative/normal.  The Multi-Cancer + RNA Panel offered by [facility] includes sequencing and/or  deletion/duplication analysis of the following 70 genes:  AIP*, ALK, APC*, ATM*, AXIN2*, BAP1*, BARD1*, BLM*, BMPR1A*, BRCA1*, BRCA2*, BRIP1*, CDC73*, CDH1*, CDK4, CDKN1B*, CDKN2A, CHEK2*, CTNNA1*, DICER1*, EPCAM, EGFR, FH*, FLCN*, GREM1, HOXB13, KIT, LZTR1, MAX*, MBD4, MEN1*, MET, MITF, MLH1*, MSH2*, MSH3*, MSH6*, MUTYH*, NF1*, NF2*, NTHL1*, PALB2*, PDGFRA, PMS2*, POLD1*, POLE*, POT1*, PRKAR1A*, PTCH1*, PTEN*, RAD51C*, RAD51D*, RB1*, RET, SDHA*, SDHAF2*, SDHB*, SDHC*, SDHD*, SMAD4*, SMARCA4*, SMARCB1*, SMARCE1*, STK11*, SUFU*, TMEM127*, TP53*, TSC1*, TSC2*, VHL*. RNA analysis is performed for * genes.  The test report has been scanned into EPIC and is located under the Molecular Pathology section of the Results Review tab.  A portion of the result report is included below for reference. Genetic testing reported out on 11/03/2022.     Cancer Risks for the p.I1307K variant in the APC gene: Colon cancer, 5-10% risk  This information is based on current understanding of the gene and may change in the future.  Management Recommendations: Colonoscopy screening every 5 years beginning at age 1 If an individual has a first-degree relative with colorectal cancer, screening should begin 10 years prior to the relative's age at diagnosis or at age 36, whichever comes first.  Implications for Family Members: Hereditary predisposition to cancer due to pathogenic variants in the APC gene has autosomal dominant inheritance. This means that an individual with a pathogenic variant has a 50% chance of passing the condition on to his/her offspring. Identification of a pathogenic variant allows for the recognition of at-risk relatives who can pursue  testing for the familial variant.  Family members are encouraged to consider genetic testing for this familial pathogenic variant. As there are generally no childhood cancer risks associated with pathogenic variants in the APC gene p. I1307K pathogenic variant, individuals  in the family are not recommended to have testing until they reach at least 67 years of age. They may contact our office at [phone] for more information or to schedule an appointment.  Complimentary testing for the familial variant is available for 90 days from the report date.  Family members who live outside of the area are encouraged to find a genetic counselor in their area by visiting: [email or web address].  PLAN: These results will be shared with her referring provider, Dr. Bryan Lemma, and her PCP Dr. Jeanie Cooks. She would like these providers to follow her long-term for this indication. Ms. Eickhoff plans to share these results with her family  Resources: FORCE (Facing Our Risk of Cancer Empowered) is a resource for those with a hereditary predisposition to develop cancer.  FORCE provides information about risk reduction, advocacy, legislation, and clinical trials.  Additionally, FORCE provides a platform for collaboration and support; which includes: peer navigation, message boards, local support groups, a toll-free helpline, research registry and recruitment, advocate training, published medical research, webinars, brochures, mastectomy photos, and more.  For more information, visit www.facingourrisk.org  Faith Rogue, MS, [facility] Genetic Counselor [email or web address] Phone: [phone]

## 2022-11-10 ENCOUNTER — Encounter: Payer: Self-pay | Admitting: Licensed Clinical Social Worker

## 2022-11-11 ENCOUNTER — Telehealth: Payer: Self-pay

## 2022-11-11 NOTE — Telephone Encounter (Signed)
Appt. Scheduled with [place] Crane, Suprevisor incharge of [facility] on 01/04/23 at 10:40 am with Dr. Bryan Lemma.

## 2022-11-11 NOTE — Telephone Encounter (Signed)
-----   Message from [place], DO sent at 11/11/2022  1:18 PM EST ----- Thanks Denton Ar!  Brisia Schuermann, can you please schedule OV with me to discuss genetic testing results and plan for ongoing colon cancer screening.   Thanks!  ----- Message ----- From: Eyvonne Mechanic Sent: 11/04/2022  11:37 AM EST To: Lavena Bullion, DO  Hi Dr. Bryan Lemma and Dr. 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  Thank you! Denton Ar

## 2023-01-04 ENCOUNTER — Encounter: Payer: Self-pay | Admitting: Gastroenterology

## 2023-01-04 ENCOUNTER — Ambulatory Visit (INDEPENDENT_AMBULATORY_CARE_PROVIDER_SITE_OTHER): Payer: Medicare Other | Admitting: Gastroenterology

## 2023-01-04 VITALS — BP 134/72 | HR 70 | Ht 62.0 in | Wt 180.0 lb

## 2023-01-04 DIAGNOSIS — Z1509 Genetic susceptibility to other malignant neoplasm: Secondary | ICD-10-CM

## 2023-01-04 DIAGNOSIS — Z1589 Genetic susceptibility to other disease: Secondary | ICD-10-CM | POA: Diagnosis not present

## 2023-01-04 DIAGNOSIS — Z86010 Personal history of colonic polyps: Secondary | ICD-10-CM

## 2023-01-04 DIAGNOSIS — Z800 Family history of malignant neoplasm of digestive organs: Secondary | ICD-10-CM

## 2023-01-04 DIAGNOSIS — Z87891 Personal history of nicotine dependence: Secondary | ICD-10-CM

## 2023-01-04 NOTE — Progress Notes (Signed)
Chief Complaint: History of colon polyps, family history of colon cancer    HPI:     Joyce Solis is a 67 y.o. female with a history of COPD, depression, HTN, schizophrenia, seizures, GERD, colon polyps, presenting to the Gastroenterology Clinic to discuss results of recent colonoscopy and genetic testing as outlined below. Presents to the office with caretaker.   - 1992: Colonoscopy: 1-2 polyps - 07/2022: Colonoscopy: 13 tubular adenomas. Sigmoid diverticulosis, small internal hemorrhoids. Recommended repeat colonoscopy in 1 year and referral to the [facility]  Was seen in the [facility] on 10/24/2022.  Family history notable for sister with breast cancer in her late 74s, father with lung cancer, paternal grandmother with colon cancer over age 14.  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 Of note, this result DOES NOT indicate a diagnosis of familial adenomatous polyposis syndrome (FAP).The remainder of testing was negative/normal.  These results convey a 5-10% lifetime risk of colon cancer.  Otherwise, feeling in her baseline state of health. Recently stopped smoking.    Past Medical History:  Diagnosis Date   Anxiety    on meds   Arthritis    Asthma    uses inhaler   COPD (chronic obstructive pulmonary disease)    uses inhaler   Depression    on meds   GERD (gastroesophageal reflux disease)    Hypertension    on meds   IBS (irritable bowel syndrome)    Medical history non-contributory    Schizophrenia    Seizures    2022 due to high blood pressure and medications     Past Surgical History:  Procedure Laterality Date   COLONOSCOPY     DENTAL SURGERY  1992   Family History  Problem Relation Age of Onset   COPD Mother    Lung cancer Father    Breast cancer Sister        dx late 24s   Parkinson's disease Sister    Colon cancer Paternal Grandmother        dx >50   Colon polyps Neg Hx     Esophageal cancer Neg Hx    Rectal cancer Neg Hx    Stomach cancer Neg Hx    Social History   Tobacco Use   Smoking status: Former    Types: Cigarettes, Cigars   Smokeless tobacco: Never   Tobacco comments:    10 cigar per day  Vaping Use   Vaping Use: Never used  Substance Use Topics   Alcohol use: Never   Drug use: Never   Current Outpatient Medications  Medication Sig Dispense Refill   benztropine (COGENTIN) 1 MG tablet Take 1 mg by mouth daily.     cholecalciferol (VITAMIN D3) 25 MCG (1000 UNIT) tablet Take 2 tablets by mouth daily at 6 (six) AM.     clotrimazole-betamethasone (LOTRISONE) cream Apply 1 Application topically daily as needed.     cyanocobalamin (VITAMIN B12) 500 MCG tablet Take 1 tablet by mouth daily at 6 (six) AM.     docusate sodium (COLACE) 100 MG capsule Take 200 mg by mouth as needed for mild constipation or moderate constipation.     hydrOXYzine (ATARAX) 25 MG tablet Take 25 mg by mouth 2 (two) times daily as needed.     ibuprofen (ADVIL) 600 MG tablet Take 1 tablet (600 mg total) by mouth every 6 (six) hours as  needed for headache. 30 tablet 0   PROMETHAZINE-DM PO Take 10 mLs by mouth 2 (two) times daily as needed (cough and congestion).     risperiDONE (RISPERDAL) 2 MG tablet Take 0.5 tablets by mouth 2 (two) times daily.     sertraline (ZOLOFT) 50 MG tablet Take 1.5 tablets by mouth daily at 6 (six) AM.     SYMBICORT 160-4.5 MCG/ACT inhaler Inhale 2 puffs into the lungs in the morning and at bedtime.     triamcinolone cream (KENALOG) 0.5 % Apply 1 Application topically in the morning and at bedtime.     VENTOLIN HFA 108 (90 Base) MCG/ACT inhaler Inhale 2 puffs into the lungs 4 (four) times daily as needed.     No current facility-administered medications for this visit.   Allergies  Allergen Reactions   Piroxicam Shortness Of Breath     Review of Systems: All systems reviewed and negative except where noted in HPI.     Physical Exam:    Wt  Readings from Last 3 Encounters:  01/04/23 180 lb (81.6 kg)  08/04/22 180 lb (81.6 kg)  07/06/22 180 lb (81.6 kg)    BP 134/72   Pulse 70   Ht  (1.575 m)   Wt 180 lb (81.6 kg)   SpO2 98%   BMI 32.92 kg/m  Constitutional:  Pleasant, in no acute distress. Psychiatric: Normal mood and affect. Behavior is normal.  Skin: Skin is warm and dry. No rashes noted.   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  RTC prn    Shellia Cleverly, DO, FACG  01/04/2023, 11:03 AM   Fleet Contras, MD

## 2023-01-04 NOTE — Patient Instructions (Addendum)
Follow up as needed.  It has been recommended to you by your physician that you have a Colonoscopy completed in November, 2024. Please contact our office at [phone] should you decide to have the procedure completed. You will be scheduled for a pre-visit and procedure at that time.   _______________________________________________________  If your blood pressure at your visit was 140/90 or greater, please contact your primary care physician to follow up on this.  _______________________________________________________  If you are age 103 or older, your body mass index should be between 23-30. Your Body mass index is 32.92 kg/m. If this is out of the aforementioned range listed, please consider follow up with your Primary Care Provider.   __________________________________________________________  The [place] [facility] providers would like to encourage you to use [facility] to communicate with providers for non-urgent requests or questions.  Due to long hold times on the telephone, sending your provider a message by [facility] may be a faster and more efficient way to get a response.  Please allow 48 business hours for a response.  Please remember that this is for non-urgent requests.   Due to recent changes in healthcare laws, you may see the results of your imaging and laboratory studies on MyChart before your provider has had a chance to review them.  We understand that in some cases there may be results that are confusing or concerning to you. Not all laboratory results come back in the same time frame and the provider may be waiting for multiple results in order to interpret others.  Please give [place] 48 hours in order for your provider to thoroughly review all the results before contacting the office for clarification of your results.    Thank you for choosing me and [facility].  Vito Cirigliano, D.O.

## 2023-02-01 ENCOUNTER — Other Ambulatory Visit: Payer: Self-pay | Admitting: Internal Medicine

## 2023-02-02 LAB — LIPID PANEL
Cholesterol: 242 mg/dL — ABNORMAL HIGH (ref <200)
HDL: 55 mg/dL (ref >=50)
LDL Cholesterol (Calc): 155 mg/dL (calc) — ABNORMAL HIGH
Non-HDL Cholesterol (Calc): 187 mg/dL (calc) — ABNORMAL HIGH (ref <130)
Total CHOL/HDL Ratio: 4.4 (calc) (ref <5.0)
Triglycerides: 180 mg/dL — ABNORMAL HIGH (ref <150)

## 2023-02-02 LAB — EXTRA LAV TOP TUBE

## 2023-02-02 LAB — VITAMIN B12: Vitamin B-12: 786 pg/mL (ref 200–1100)

## 2023-02-02 LAB — URINE CULTURE
MICRO NUMBER:: 14960121
SPECIMEN QUALITY:: ADEQUATE

## 2023-02-02 LAB — VITAMIN D 25 HYDROXY (VIT D DEFICIENCY, FRACTURES): Vit D, 25-Hydroxy: 37 ng/mL (ref 30–100)

## 2023-02-02 LAB — FOLATE: Folate: 14.2 ng/mL

## 2023-05-05 IMAGING — MG MM DIGITAL SCREENING BILAT W/ TOMO AND CAD
8 series · 8 of 24 positions shown · non-contrast
Comparison: Previous exam(s).

CLINICAL DATA: Screening.

EXAM:
DIGITAL SCREENING BILATERAL MAMMOGRAM WITH TOMOSYNTHESIS AND CAD
TECHNIQUE: Bilateral screening digital craniocaudal and mediolateral oblique
mammograms were obtained. Bilateral screening digital breast
tomosynthesis was performed. The images were evaluated with
computer-aided detection.

[L CC synth-2D]
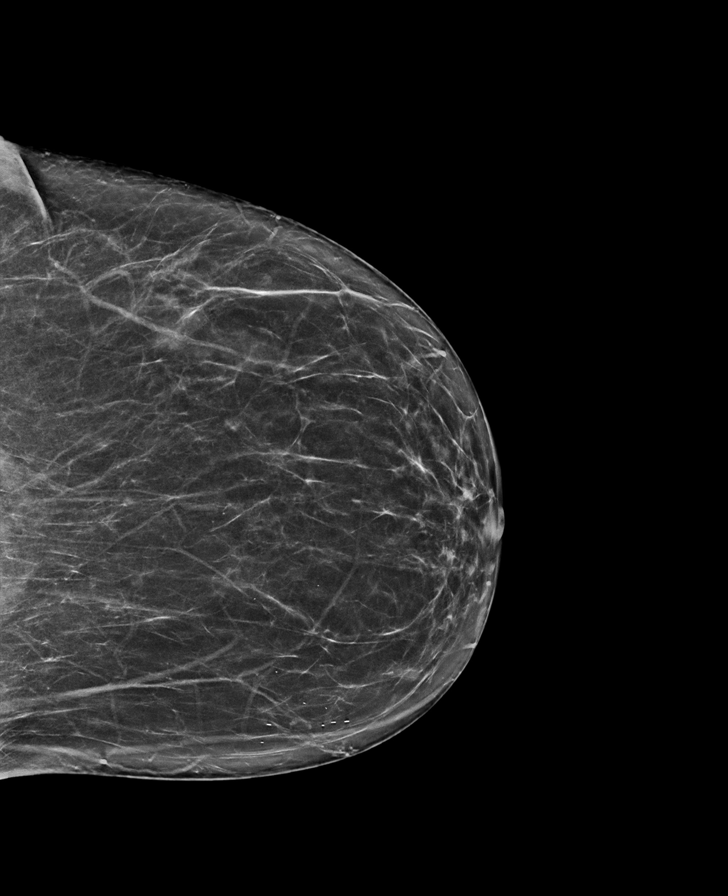

[R CC synth-2D]
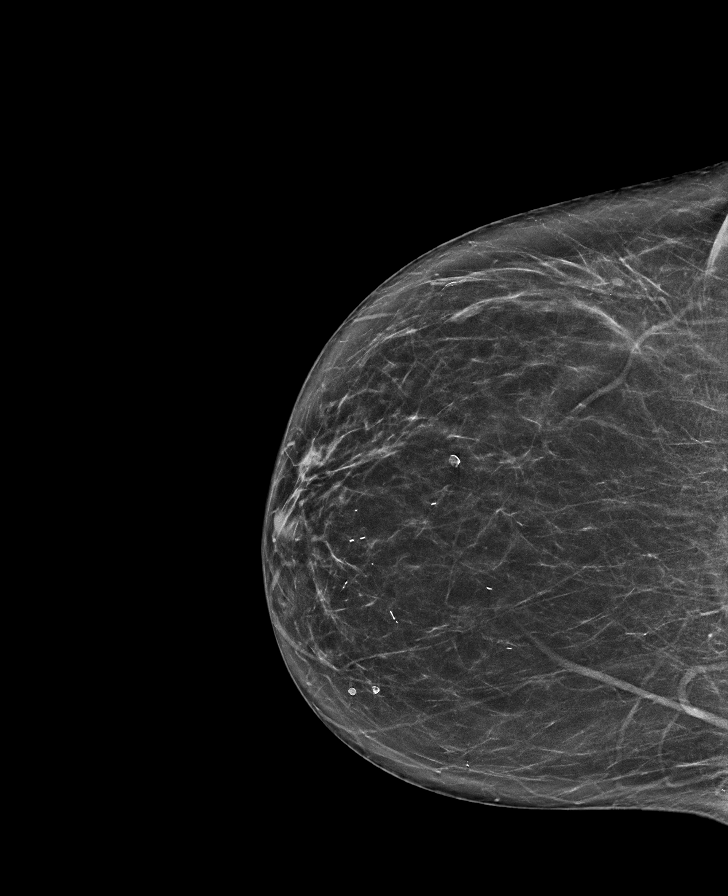

[R MLO synth-2D]
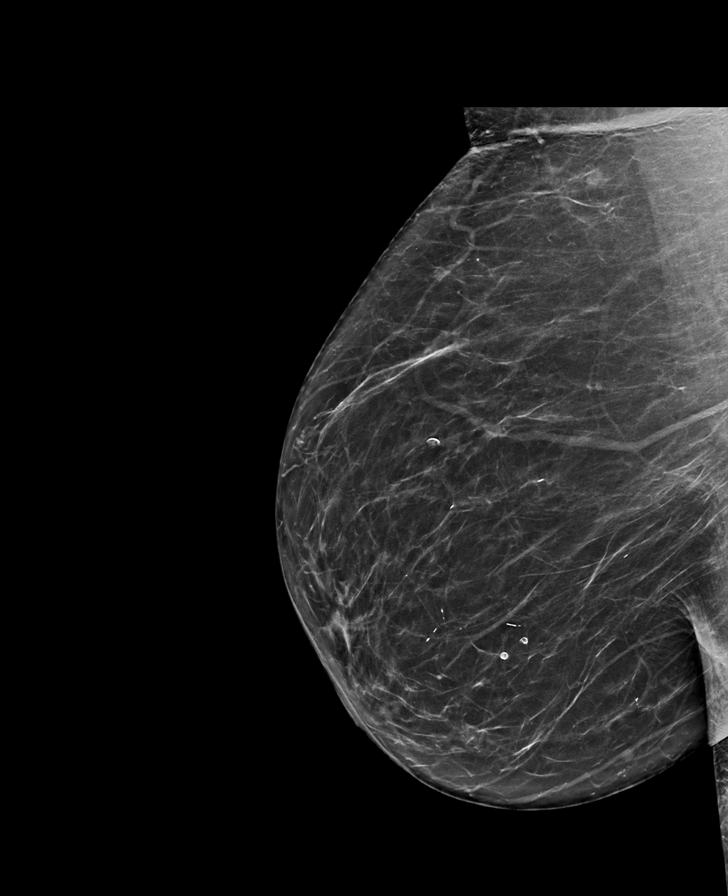

[L MLO synth-2D]
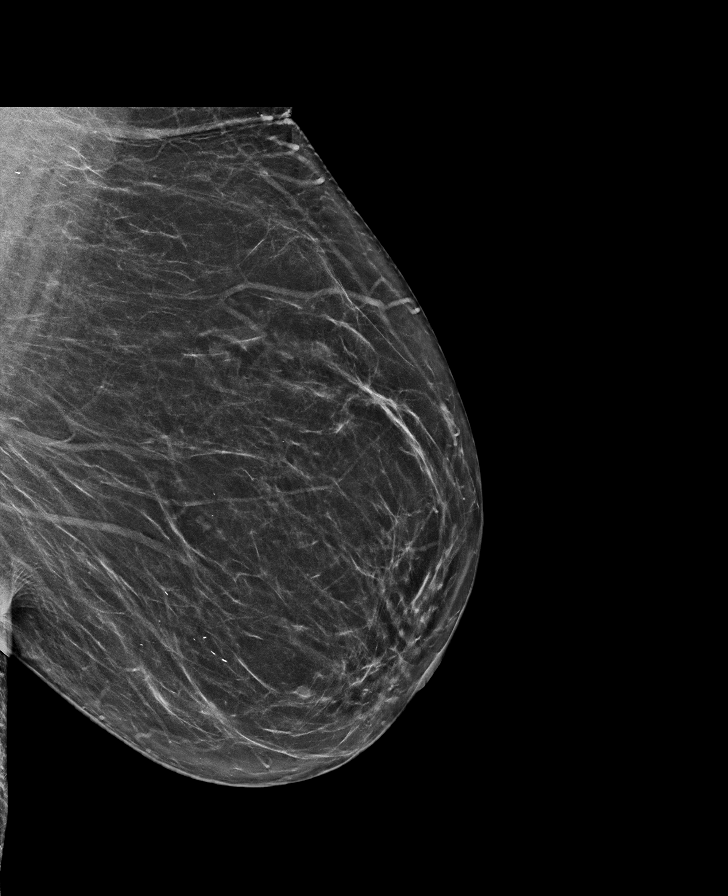

[R MLO tomo · tomo slice 37/74.0]
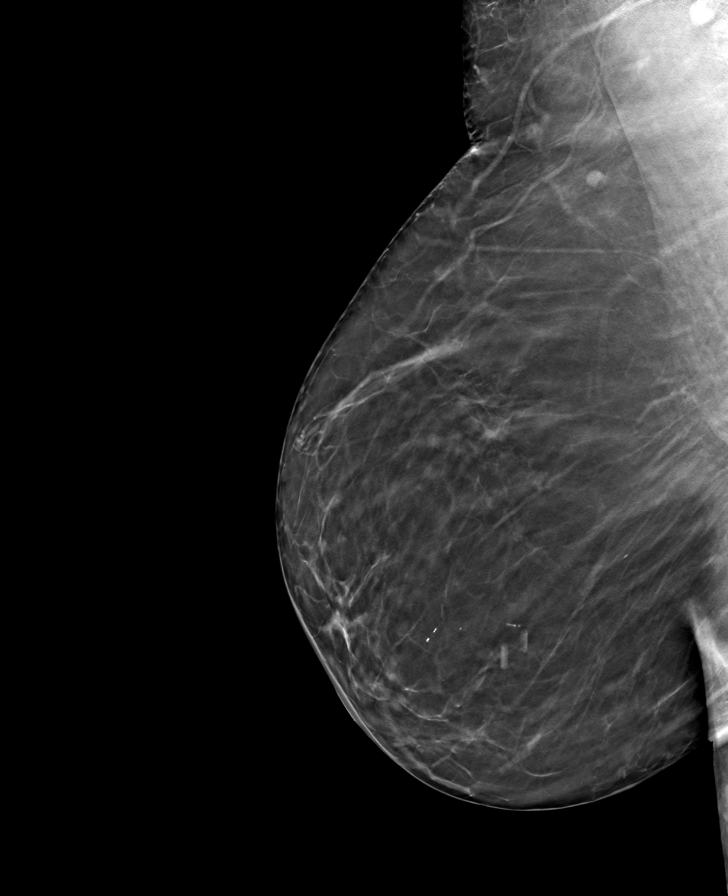

[L CC tomo · tomo slice 35/69.0]
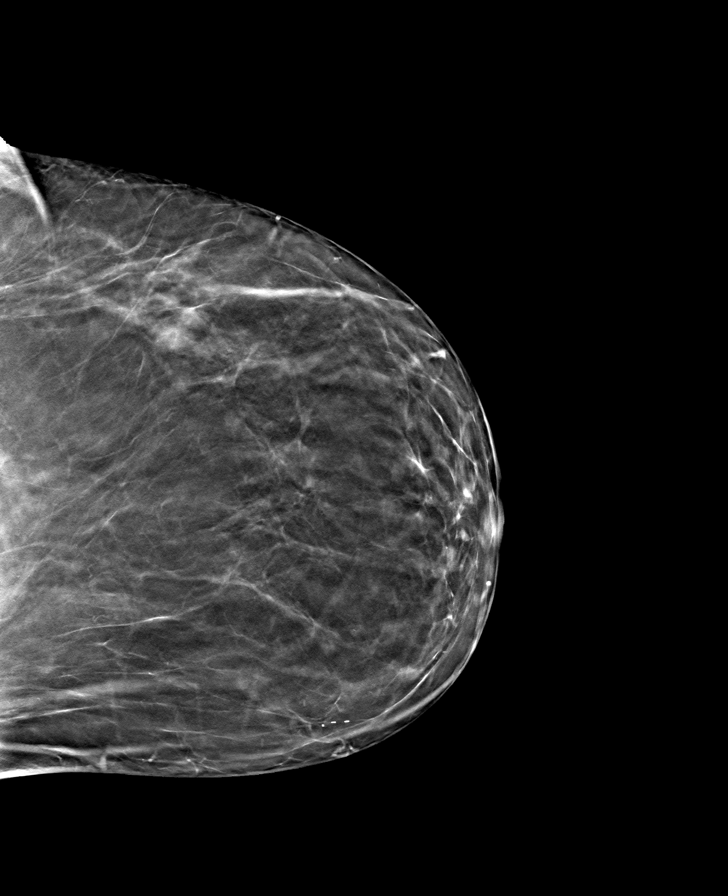

[L MLO tomo · tomo slice 36/71.0]
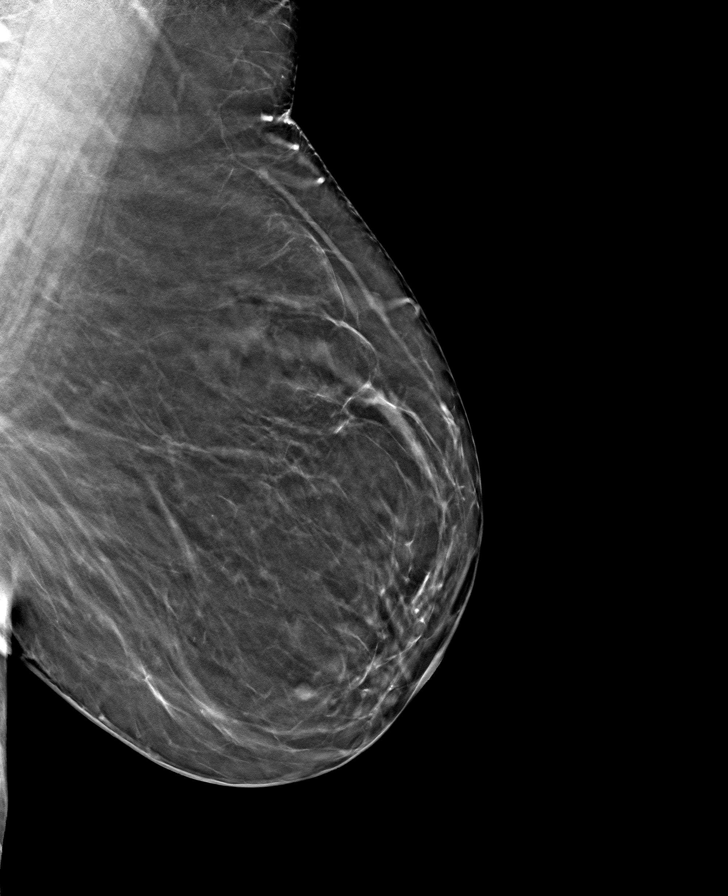

[R CC tomo · tomo slice 35/68.0]
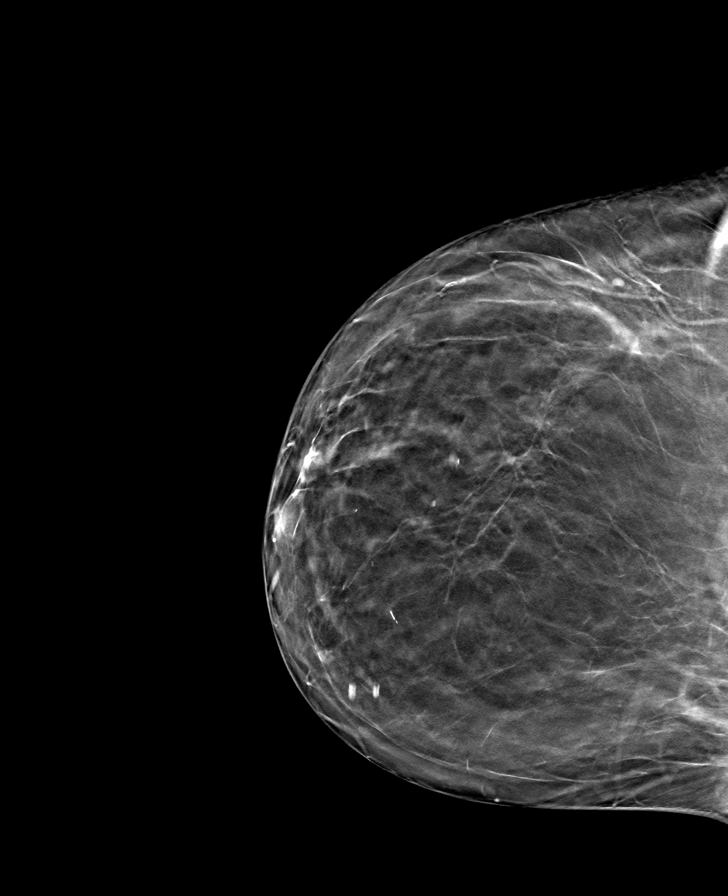

[8 of 24 positions shown; findings below may reference images not displayed]

ACR Breast Density Category b: There are scattered areas of
fibroglandular density.
FINDINGS: There are no findings suspicious for malignancy.
IMPRESSION: No mammographic evidence of malignancy. A result letter of this
screening mammogram will be mailed directly to the patient.

RECOMMENDATION:
Screening mammogram in one year. (Code:51-O-LD2)

BI-RADS CATEGORY  1: Negative.

## 2023-07-26 ENCOUNTER — Other Ambulatory Visit: Payer: Self-pay | Admitting: Internal Medicine

## 2023-07-26 DIAGNOSIS — Z0000 Encounter for general adult medical examination without abnormal findings: Secondary | ICD-10-CM

## 2023-09-04 ENCOUNTER — Ambulatory Visit
Admission: RE | Admit: 2023-09-04 | Discharge: 2023-09-04 | Disposition: A | Discharge status: Home / Self Care | Payer: Medicare Other | Source: Ambulatory Visit | Attending: Internal Medicine | Admitting: Internal Medicine

## 2023-09-04 DIAGNOSIS — Z0000 Encounter for general adult medical examination without abnormal findings: Secondary | ICD-10-CM

## 2024-06-19 ENCOUNTER — Other Ambulatory Visit: Payer: Self-pay | Admitting: Internal Medicine

## 2024-06-19 DIAGNOSIS — Z1231 Encounter for screening mammogram for malignant neoplasm of breast: Secondary | ICD-10-CM

## 2024-07-20 ENCOUNTER — Other Ambulatory Visit: Payer: Self-pay

## 2024-07-20 ENCOUNTER — Emergency Department (HOSPITAL_COMMUNITY)

## 2024-07-20 ENCOUNTER — Emergency Department (HOSPITAL_COMMUNITY)
Admission: EM | Admit: 2024-07-20 | Discharge: 2024-07-20 | Disposition: A | Discharge status: Home / Self Care | Attending: Emergency Medicine | Admitting: Emergency Medicine

## 2024-07-20 ENCOUNTER — Encounter (HOSPITAL_COMMUNITY): Payer: Self-pay | Admitting: *Deleted

## 2024-07-20 DIAGNOSIS — Y9301 Activity, walking, marching and hiking: Secondary | ICD-10-CM | POA: Diagnosis not present

## 2024-07-20 DIAGNOSIS — S42201A Unspecified fracture of upper end of right humerus, initial encounter for closed fracture: Secondary | ICD-10-CM | POA: Diagnosis not present

## 2024-07-20 DIAGNOSIS — W010XXA Fall on same level from slipping, tripping and stumbling without subsequent striking against object, initial encounter: Secondary | ICD-10-CM | POA: Diagnosis not present

## 2024-07-20 DIAGNOSIS — J449 Chronic obstructive pulmonary disease, unspecified: Secondary | ICD-10-CM | POA: Diagnosis not present

## 2024-07-20 DIAGNOSIS — I10 Essential (primary) hypertension: Secondary | ICD-10-CM | POA: Diagnosis not present

## 2024-07-20 DIAGNOSIS — M79641 Pain in right hand: Secondary | ICD-10-CM | POA: Diagnosis present

## 2024-07-20 NOTE — ED Triage Notes (Signed)
 Pt here via [facility] from [facility]) [address]. Joyce Solis.  PT states she tripped and fell over a door way on Thursday.  Today she was having difficulty moving her R arm d/t pain.  Pt able to squeeze R hand but unable to lift R arm.  Bruising noted.  Pt ao x 4.

## 2024-07-20 NOTE — ED Notes (Signed)
 Pt was given cab voucher. Registration called taxi for the pt, pt is waiting in the lobby. Sort staff notified.

## 2024-07-20 NOTE — ED Provider Notes (Signed)
 [place] EMERGENCY DEPARTMENT AT [facility] Provider Note   CSN: [id number] Arrival date & time: 07/20/24  1432     Patient presents with: Joyce Solis is a 68 y.o. female.    Fall     Patient has a history of IBS schizophrenia depression acid reflux COPD hypertension seizures.  Patient states she tripped on Thursday when she was walking through a doorway.  She injured her right arm.  Patient states has been having difficulty moving her arm since then.  Patient states it hurts to raise her arm.  She denies any headaches.  No difficulty breathing.  No abdominal pain.  No pain in her lower extremities  Prior to Admission medications   Medication Sig Start Date End Date Taking? Authorizing Provider  benztropine (COGENTIN) 1 MG tablet Take 1 mg by mouth daily. 06/27/22   [provider]  cholecalciferol (VITAMIN D3) 25 MCG (1000 UNIT) tablet Take 2 tablets by mouth daily at 6 (six) AM. 12/13/17   [provider]  clotrimazole-betamethasone (LOTRISONE) cream Apply 1 Application topically daily as needed. 06/27/22   [provider]  cyanocobalamin (VITAMIN B12) 500 MCG tablet Take 1 tablet by mouth daily at 6 (six) AM. 12/13/17   [provider]  docusate sodium (COLACE) 100 MG capsule Take 200 mg by mouth as needed for mild constipation or moderate constipation.    [provider]  hydrOXYzine (ATARAX) 25 MG tablet Take 25 mg by mouth 2 (two) times daily as needed. 06/27/22   [provider]  ibuprofen  (ADVIL ) 600 MG tablet Take 1 tablet (600 mg total) by mouth every 6 (six) hours as needed for headache. 08/16/19   Schuyler Charlie RAMAN, MD  PROMETHAZINE-DM PO Take 10 mLs by mouth 2 (two) times daily as needed (cough and congestion).    [provider]  risperiDONE (RISPERDAL) 2 MG tablet Take 0.5 tablets by mouth 2 (two) times daily. 06/27/22   [provider]  sertraline (ZOLOFT) 50 MG tablet Take 1.5  tablets by mouth daily at 6 (six) AM. 06/27/22   [provider]  SYMBICORT 160-4.5 MCG/ACT inhaler Inhale 2 puffs into the lungs in the morning and at bedtime. 06/27/22   [provider]  triamcinolone cream (KENALOG) 0.5 % Apply 1 Application topically in the morning and at bedtime. 06/27/22   [provider]  VENTOLIN HFA 108 (90 Base) MCG/ACT inhaler Inhale 2 puffs into the lungs 4 (four) times daily as needed. 01/10/22   [provider]    Allergies: Piroxicam    Review of Systems  Updated Vital Signs BP 131/80   Pulse 88   Temp 98.7 F (37.1 C) (Oral)   Resp 17   Ht 1.575 m (5' 2)   Wt 81.6 kg   SpO2 96%   BMI 32.90 kg/m   Physical Exam Vitals and nursing note reviewed.  Constitutional:      General: She is not in acute distress.    Appearance: She is well-developed.  HENT:     Head: Normocephalic and atraumatic.     Right Ear: External ear normal.     Left Ear: External ear normal.  Eyes:     General: No scleral icterus.       Right eye: No discharge.        Left eye: No discharge.     Conjunctiva/sclera: Conjunctivae normal.  Neck:     Trachea: No tracheal deviation.  Cardiovascular:  Rate and Rhythm: Normal rate.  Pulmonary:     Effort: Pulmonary effort is normal. No respiratory distress.     Breath sounds: No stridor.  Abdominal:     General: There is no distension.  Musculoskeletal:        General: Tenderness present. No swelling or deformity.     Right shoulder: Tenderness and bony tenderness present.     Right elbow: Normal.     Right wrist: Normal.     Cervical back: Normal and neck supple.     Thoracic back: Normal.     Lumbar back: Normal.  Skin:    General: Skin is warm and dry.     Findings: No rash.  Neurological:     Mental Status: She is alert. Mental status is at baseline.     Cranial Nerves: No dysarthria or facial asymmetry.     Motor: No seizure activity.     (all labs ordered are listed, but  only abnormal results are displayed) Labs Reviewed - No data to display  EKG: None  Radiology: DG Shoulder Right Result Date: 07/20/2024 EXAM: 1 VIEW XRAY OF THE RIGHT SHOULDER 07/20/2024 03:14:00 PM COMPARISON: None available. CLINICAL HISTORY: fall pain FINDINGS: BONES AND JOINTS: Mildly comminuted fracture seen involving proximal right humeral head and neck. Mild inferior subluxation of humeral head is noted relative to glenoid fossa. Mild degenerative changes seen involving right acromioclavicular joint. SOFT TISSUES: No abnormal calcifications. Visualized lung is unremarkable. IMPRESSION: 1. Mildly comminuted fracture of the proximal right humeral head and neck with mild inferior subluxation of the humeral head relative to the glenoid fossa. 2. Mild right acromioclavicular joint osteoarthritis. Electronically signed by: Lynwood Seip MD 07/20/2024 03:26 PM EDT RP Workstation: [id number]   DG Chest 1 View Result Date: 07/20/2024 EXAM: 1 VIEW XRAY OF THE CHEST 07/20/2024 03:14:00 PM COMPARISON: None available. CLINICAL HISTORY: Fall pain. FINDINGS: LUNGS AND PLEURA: No focal pulmonary opacity. No pulmonary edema. No pleural effusion. No pneumothorax. HEART AND MEDIASTINUM: No acute abnormality of the cardiac and mediastinal silhouettes. BONES AND SOFT TISSUES: No acute osseous abnormality. IMPRESSION: 1. No acute process. Electronically signed by: Lynwood Seip MD 07/20/2024 03:24 PM EDT RP Workstation: [id number]     Procedures   Medications Ordered in the ED - No data to display                                  Medical Decision Making  Patient presented to the ED for evaluation after a fall a few days ago.  X-rays do show a fracture of the proximal humerus.  Patient denies any headache or head injury.  She is not having any focal numbness or weakness.  Patient placed in the swelling.  Will have her follow-up with orthopedics as an outpatient     Final diagnoses:  Closed fracture of  proximal end of right humerus, unspecified fracture morphology, initial encounter    ED Discharge Orders     None          Randol Simmonds, MD 07/20/24 2339

## 2024-07-20 NOTE — ED Provider Triage Note (Signed)
 Emergency Medicine Provider Triage Evaluation Note  Joyce Solis , a 68 y.o. female  was evaluated in triage.  Pt complains of fall a few days ago.  Right arm bruising and pain.  Review of Systems  Positive: Shoulder pain Negative: Head injury  Physical Exam  BP (!) 126/57   Pulse 79   Temp 98.7 F (37.1 C) (Oral)   Resp 17   Ht 5' 2 (1.575 m)   Wt 81.6 kg   SpO2 96%   BMI 32.90 kg/m  Gen:   Awake, no distress   Resp:  Normal effort  MSK:   Right arm bruising and diffuse tenderness about her proximal humerus Other:   Medical Decision Making  Medically screening exam initiated at 2:53 PM.  Appropriate orders placed.  AESHA AGRAWAL was informed that the remainder of the evaluation will be completed by another provider, this initial triage assessment does not replace that evaluation, and the importance of remaining in the ED until their evaluation is complete.     Towana Ozell BROCKS, MD 07/20/24 [phone]

## 2024-07-20 NOTE — Progress Notes (Signed)
 Orthopedic Tech Progress Note Patient Details:  Joyce Solis 1956-06-10 [id number]  Ortho Devices Type of Ortho Device: Sling immobilizer Ortho Device/Splint Location: RUE Ortho Device/Splint Interventions: Application   Post Interventions Patient Tolerated: Well  Massie BRAVO Lurae Hornbrook 07/20/2024, 5:15 PM

## 2024-07-20 NOTE — Discharge Instructions (Signed)
 X-ray shows a fracture in your humerus bone near the shoulder joint.  Keep the sling on to stabilize the arm.  Call Dr. Jetta office to schedule anappointment.  He is an orthopedic specialist.

## 2024-09-05 ENCOUNTER — Inpatient Hospital Stay: Admission: RE | Admit: 2024-09-05 | Discharge: 2024-09-05 | Attending: Internal Medicine | Admitting: Internal Medicine

## 2024-09-05 ENCOUNTER — Ambulatory Visit

## 2024-09-05 DIAGNOSIS — Z1231 Encounter for screening mammogram for malignant neoplasm of breast: Secondary | ICD-10-CM
